# Patient Record
Sex: Male | Born: 1950 | Race: White | Hispanic: No | Marital: Single | Smoking: Former smoker
Health system: Southern US, Community
[De-identification: ages and names within clinical notes are randomized; demographics above are authoritative.]

## PROBLEM LIST (undated history)

## (undated) DIAGNOSIS — E119 Type 2 diabetes mellitus without complications: Secondary | ICD-10-CM

## (undated) DIAGNOSIS — G629 Polyneuropathy, unspecified: Secondary | ICD-10-CM

## (undated) DIAGNOSIS — E785 Hyperlipidemia, unspecified: Secondary | ICD-10-CM

## (undated) DIAGNOSIS — T4145XA Adverse effect of unspecified anesthetic, initial encounter: Secondary | ICD-10-CM

## (undated) DIAGNOSIS — T8859XA Other complications of anesthesia, initial encounter: Secondary | ICD-10-CM

## (undated) DIAGNOSIS — Z9289 Personal history of other medical treatment: Secondary | ICD-10-CM

## (undated) DIAGNOSIS — I10 Essential (primary) hypertension: Secondary | ICD-10-CM

## (undated) DIAGNOSIS — I2510 Atherosclerotic heart disease of native coronary artery without angina pectoris: Secondary | ICD-10-CM

## (undated) HISTORY — PX: WISDOM TOOTH EXTRACTION: SHX21

## (undated) HISTORY — PX: CORONARY ANGIOPLASTY WITH STENT PLACEMENT: SHX49

---

## 1982-04-27 HISTORY — PX: APPENDECTOMY: SHX54

## 1992-04-27 HISTORY — PX: ANGIOPLASTY: SHX39

## 2012-04-27 HISTORY — PX: AMPUTATION: SHX166

## 2013-04-20 HISTORY — PX: BELOW KNEE LEG AMPUTATION: SUR23

## 2013-07-14 ENCOUNTER — Encounter (HOSPITAL_BASED_OUTPATIENT_CLINIC_OR_DEPARTMENT_OTHER): Payer: Self-pay | Admitting: Emergency Medicine

## 2013-07-14 ENCOUNTER — Emergency Department (HOSPITAL_BASED_OUTPATIENT_CLINIC_OR_DEPARTMENT_OTHER)
Admission: EM | Admit: 2013-07-14 | Discharge: 2013-07-14 | Disposition: A | Discharge status: Home / Self Care | Payer: Medicare Other | Attending: Emergency Medicine | Admitting: Emergency Medicine

## 2013-07-14 DIAGNOSIS — Z79899 Other long term (current) drug therapy: Secondary | ICD-10-CM | POA: Insufficient documentation

## 2013-07-14 DIAGNOSIS — E119 Type 2 diabetes mellitus without complications: Secondary | ICD-10-CM | POA: Insufficient documentation

## 2013-07-14 DIAGNOSIS — Y836 Removal of other organ (partial) (total) as the cause of abnormal reaction of the patient, or of later complication, without mention of misadventure at the time of the procedure: Secondary | ICD-10-CM | POA: Insufficient documentation

## 2013-07-14 DIAGNOSIS — Z9861 Coronary angioplasty status: Secondary | ICD-10-CM | POA: Insufficient documentation

## 2013-07-14 DIAGNOSIS — Z880 Allergy status to penicillin: Secondary | ICD-10-CM | POA: Insufficient documentation

## 2013-07-14 DIAGNOSIS — Z87891 Personal history of nicotine dependence: Secondary | ICD-10-CM | POA: Insufficient documentation

## 2013-07-14 DIAGNOSIS — I10 Essential (primary) hypertension: Secondary | ICD-10-CM | POA: Insufficient documentation

## 2013-07-14 DIAGNOSIS — T8131XA Disruption of external operation (surgical) wound, not elsewhere classified, initial encounter: Secondary | ICD-10-CM | POA: Diagnosis present

## 2013-07-14 HISTORY — DX: Type 2 diabetes mellitus without complications: E11.9

## 2013-07-14 HISTORY — DX: Essential (primary) hypertension: I10

## 2013-07-14 NOTE — Discharge Instructions (Signed)
Wound Dehiscence Wound dehiscence is when a surgical cut (incision) opens up and does not heal properly. It usually happens 7 10 days after surgery. You may have bleeding from the cut. You may also have pain or a fever. This condition should be treated early. HOME CARE  Only take medicines as told by your doctor.  Take your antibiotic medicine as told. Finish it even if you start to feel better.  Wash your wound with warm, soapy water 2 times a day, or as told. Pat the wound dry. Do not rub the wound.  Change bandages (dressings) as often as told. Wash your hands before and after changing bandages. Apply bandages as told.  Take showers. Do not soak the wound, bathe, or swim until your wound is healed.  Avoid exercises that make you sweat.  Use medicines that stop itching as told by your doctor. The wound may itch as it heals. Do not pick or scratch at the wound.  Do not lift more than 10 pounds (4.5 kilograms) until the wound is healed, or as told by your doctor.  Keep all doctor visits as told. GET HELP IF:  You have a lot of bleeding from your surgical cut.  Your wound does not seem to be healing right. GET HELP RIGHT AWAY IF:   You have more puffiness (swelling) or redness around the wound.  You have more pain in the wound.  You have yellowish white fluid (pus) coming from the wound.  More of the wound breaks open.  You have a fever. MAKE SURE YOU:   Understand these instructions.  Will watch your condition.  Will get help right away if you are not doing well or get worse. Document Released: 04/01/2009 Document Revised: 02/01/2013 Document Reviewed: 12/19/2012 Exit[employer] Patient Information 2014 [employer].  

## 2013-07-14 NOTE — ED Notes (Signed)
Pt was in [place]-dx with spider bite that resulted in left leg BKA Apr 14, 2013-Pt states he was dropped on leg stump by PT this week and wound opened up again-pt [place] today and came to ED-pt request to have area assessed
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
## 2013-07-20 ENCOUNTER — Encounter (HOSPITAL_COMMUNITY): Payer: Self-pay | Admitting: *Deleted

## 2013-07-20 ENCOUNTER — Other Ambulatory Visit (HOSPITAL_COMMUNITY): Payer: Self-pay | Admitting: Orthopedic Surgery

## 2013-07-20 MED ORDER — CLINDAMYCIN PHOSPHATE 900 MG/50ML IV SOLN
900.0000 mg | INTRAVENOUS | Status: AC
  Administered 2013-07-21: 900 mg via INTRAVENOUS

## 2013-07-20 NOTE — Progress Notes (Signed)
Mr Ryan Owens is coming in for revision of left below knee amputation.  Mr Ryan Owens was bitten by a posionious spider [place] the [place].  Patient has had numerous surgery starting on sole of foot, up to BKA , done 04/19/14.Mr Ryan Owens has a hx CAD and DM, he had an angioplasty in 1994, and an angio[last with stent due in 2011.  [facility] with [facility].  Mr Ryan Owens does not have a cardiologist or PCP, medications were written by a MD in [place] before the trip to [place].  Patient has a list  Of medications, not prescription bottles.  All medications are in "pill box" and he is not sure which one is which.  Patient is to bring Medication list ( which is written in [place]) to the hospital with him in am. Patient was able to read some of medications to me, Metofromin, Metoprolol, Simvastin, Flagyl.. I instructed him to not take any medications in the am, since he can not identify medications.  Mr Ryan Owens denies chest pain, shortness of breath, lightheadedness.  I spoke with Dr Massage and informed him of history.  Dr. Massage said to get [facility] and that patient will be examined when he arrives tomorrow.  I faxed request to [facility],  [facility] is only open [phone].

## 2013-07-21 ENCOUNTER — Inpatient Hospital Stay (HOSPITAL_COMMUNITY): Payer: Medicare Other | Admitting: Critical Care Medicine

## 2013-07-21 ENCOUNTER — Encounter (HOSPITAL_COMMUNITY)
Admission: RE | Disposition: A | Discharge status: Home Health | Payer: Self-pay | Source: Ambulatory Visit | Attending: Orthopedic Surgery

## 2013-07-21 ENCOUNTER — Encounter (HOSPITAL_COMMUNITY): Payer: Medicare Other | Admitting: Critical Care Medicine

## 2013-07-21 ENCOUNTER — Encounter (HOSPITAL_COMMUNITY): Payer: Self-pay | Admitting: *Deleted

## 2013-07-21 ENCOUNTER — Inpatient Hospital Stay (HOSPITAL_COMMUNITY)
Admission: RE | Admit: 2013-07-21 | Discharge: 2013-07-24 | DRG: 475 | Disposition: A | Discharge status: Home Health | Payer: Medicare Other | Source: Ambulatory Visit | Attending: Orthopedic Surgery | Admitting: Orthopedic Surgery

## 2013-07-21 ENCOUNTER — Inpatient Hospital Stay (HOSPITAL_COMMUNITY): Payer: Medicare Other

## 2013-07-21 DIAGNOSIS — Z9861 Coronary angioplasty status: Secondary | ICD-10-CM

## 2013-07-21 DIAGNOSIS — E785 Hyperlipidemia, unspecified: Secondary | ICD-10-CM | POA: Diagnosis present

## 2013-07-21 DIAGNOSIS — I10 Essential (primary) hypertension: Secondary | ICD-10-CM | POA: Diagnosis present

## 2013-07-21 DIAGNOSIS — T879 Unspecified complications of amputation stump: Secondary | ICD-10-CM

## 2013-07-21 DIAGNOSIS — M869 Osteomyelitis, unspecified: Secondary | ICD-10-CM | POA: Diagnosis present

## 2013-07-21 DIAGNOSIS — S98919A Complete traumatic amputation of unspecified foot, level unspecified, initial encounter: Secondary | ICD-10-CM

## 2013-07-21 DIAGNOSIS — Z87891 Personal history of nicotine dependence: Secondary | ICD-10-CM

## 2013-07-21 DIAGNOSIS — M9080 Osteopathy in diseases classified elsewhere, unspecified site: Secondary | ICD-10-CM | POA: Diagnosis present

## 2013-07-21 DIAGNOSIS — E1169 Type 2 diabetes mellitus with other specified complication: Secondary | ICD-10-CM | POA: Diagnosis present

## 2013-07-21 DIAGNOSIS — T8789 Other complications of amputation stump: Principal | ICD-10-CM | POA: Diagnosis present

## 2013-07-21 DIAGNOSIS — G589 Mononeuropathy, unspecified: Secondary | ICD-10-CM | POA: Diagnosis present

## 2013-07-21 HISTORY — DX: Other complications of anesthesia, initial encounter: T88.59XA

## 2013-07-21 HISTORY — PX: AMPUTATION: SHX166

## 2013-07-21 HISTORY — DX: Adverse effect of unspecified anesthetic, initial encounter: T41.45XA

## 2013-07-21 HISTORY — DX: Personal history of other medical treatment: Z92.89

## 2013-07-21 HISTORY — DX: Hyperlipidemia, unspecified: E78.5

## 2013-07-21 HISTORY — DX: Polyneuropathy, unspecified: G62.9

## 2013-07-21 LAB — CBC
HCT: 37 % — ABNORMAL LOW (ref 39.0–52.0)
HEMOGLOBIN: 13.1 g/dL (ref 13.0–17.0)
MCH: 30.2 pg (ref 26.0–34.0)
MCHC: 35.4 g/dL (ref 30.0–36.0)
MCV: 85.3 fL (ref 78.0–100.0)
Platelets: 169 10*3/uL (ref 150–400)
RBC: 4.34 MIL/uL (ref 4.22–5.81)
RDW: 14.9 % (ref 11.5–15.5)
WBC: 7.5 10*3/uL (ref 4.0–10.5)

## 2013-07-21 LAB — COMPREHENSIVE METABOLIC PANEL
ALK PHOS: 56 U/L (ref 39–117)
ALT: 17 U/L (ref 0–53)
AST: 14 U/L (ref 0–37)
Albumin: 3.7 g/dL (ref 3.5–5.2)
BILIRUBIN TOTAL: 0.5 mg/dL (ref 0.3–1.2)
BUN: 16 mg/dL (ref 6–23)
CO2: 23 meq/L (ref 19–32)
Calcium: 9.2 mg/dL (ref 8.4–10.5)
Chloride: 103 mEq/L (ref 96–112)
Creatinine, Ser: 0.9 mg/dL (ref 0.50–1.35)
GFR, EST NON AFRICAN AMERICAN: 89 mL/min — AB (ref >90)
GLUCOSE: 157 mg/dL — AB (ref 70–99)
Potassium: 4.5 mEq/L (ref 3.7–5.3)
Sodium: 140 mEq/L (ref 137–147)
Total Protein: 6.9 g/dL (ref 6.0–8.3)

## 2013-07-21 LAB — PROTIME-INR
INR: 1.06 (ref 0.00–1.49)
Prothrombin Time: 13.6 seconds (ref 11.6–15.2)

## 2013-07-21 LAB — GLUCOSE, CAPILLARY
GLUCOSE-CAPILLARY: 156 mg/dL — AB (ref 70–99)
Glucose-Capillary: 125 mg/dL — ABNORMAL HIGH (ref 70–99)
Glucose-Capillary: 258 mg/dL — ABNORMAL HIGH (ref 70–99)

## 2013-07-21 LAB — APTT: aPTT: 27 seconds (ref 24–37)

## 2013-07-21 SURGERY — AMPUTATION BELOW KNEE
Anesthesia: General | Site: Leg Lower | Laterality: Left

## 2013-07-21 MED ORDER — INSULIN ASPART 100 UNIT/ML ~~LOC~~ SOLN
0.0000 [IU] | Freq: Every day | SUBCUTANEOUS | Status: DC
Start: 2013-07-21 — End: 2013-07-24
  Administered 2013-07-21: 3 [IU] via SUBCUTANEOUS
  Administered 2013-07-23: 2 [IU] via SUBCUTANEOUS

## 2013-07-21 MED ORDER — OXYCODONE-ACETAMINOPHEN 5-325 MG PO TABS
1.0000 | ORAL_TABLET | ORAL | Status: DC | PRN
  Administered 2013-07-21 – 2013-07-23 (×8): 2 via ORAL
  Administered 2013-07-23: 1 via ORAL
  Administered 2013-07-23 – 2013-07-24 (×4): 2 via ORAL
  Filled 2013-07-21 (×3): qty 2
  Filled 2013-07-21: qty 1
  Filled 2013-07-21 (×7): qty 2

## 2013-07-21 MED ORDER — METFORMIN HCL 500 MG PO TABS
500.0000 mg | ORAL_TABLET | Freq: Two times a day (BID) | ORAL | Status: DC
  Administered 2013-07-22 – 2013-07-24 (×5): 500 mg via ORAL
  Filled 2013-07-21 (×7): qty 1

## 2013-07-21 MED ORDER — METOCLOPRAMIDE HCL 10 MG PO TABS: 5.0000 mg | ORAL_TABLET | Freq: Three times a day (TID) | ORAL | Status: DC | PRN

## 2013-07-21 MED ORDER — HYDROMORPHONE HCL PF 1 MG/ML IJ SOLN
INTRAMUSCULAR | Status: AC
Start: 2013-07-21 — End: 2013-07-22
  Filled 2013-07-21: qty 1

## 2013-07-21 MED ORDER — ASPIRIN EC 325 MG PO TBEC
325.0000 mg | DELAYED_RELEASE_TABLET | Freq: Every day | ORAL | Status: DC
Start: 2013-07-21 — End: 2013-07-24
  Administered 2013-07-21 – 2013-07-24 (×4): 325 mg via ORAL
  Filled 2013-07-21 (×4): qty 1

## 2013-07-21 MED ORDER — HYDROMORPHONE HCL PF 1 MG/ML IJ SOLN
0.2500 mg | INTRAMUSCULAR | Status: DC | PRN
  Administered 2013-07-21 (×4): 0.5 mg via INTRAVENOUS

## 2013-07-21 MED ORDER — METHOCARBAMOL 500 MG PO TABS
ORAL_TABLET | ORAL | Status: AC
  Filled 2013-07-21: qty 1

## 2013-07-21 MED ORDER — METOPROLOL SUCCINATE ER 25 MG PO TB24
25.0000 mg | ORAL_TABLET | Freq: Once | ORAL | Status: AC
  Administered 2013-07-21: 25 mg via ORAL
  Filled 2013-07-21: qty 1

## 2013-07-21 MED ORDER — FENTANYL CITRATE 0.05 MG/ML IJ SOLN
INTRAMUSCULAR | Status: DC | PRN
  Administered 2013-07-21: 50 ug via INTRAVENOUS

## 2013-07-21 MED ORDER — ISOSORBIDE MONONITRATE 10 MG PO TABS
10.0000 mg | ORAL_TABLET | Freq: Every day | ORAL | Status: DC
  Administered 2013-07-21 – 2013-07-24 (×4): 10 mg via ORAL
  Filled 2013-07-21 (×4): qty 1

## 2013-07-21 MED ORDER — PROMETHAZINE HCL 25 MG PO TABS
25.0000 mg | ORAL_TABLET | Freq: Every day | ORAL | Status: DC
  Administered 2013-07-22 – 2013-07-24 (×3): 25 mg via ORAL
  Filled 2013-07-21 (×3): qty 1

## 2013-07-21 MED ORDER — HYDROMORPHONE HCL PF 1 MG/ML IJ SOLN
INTRAMUSCULAR | Status: AC
  Filled 2013-07-21: qty 1

## 2013-07-21 MED ORDER — INSULIN ASPART 100 UNIT/ML ~~LOC~~ SOLN: 0.0000 [IU] | Freq: Three times a day (TID) | SUBCUTANEOUS | Status: DC

## 2013-07-21 MED ORDER — METOPROLOL SUCCINATE ER 25 MG PO TB24
25.0000 mg | ORAL_TABLET | Freq: Every day | ORAL | Status: DC
  Administered 2013-07-21 – 2013-07-24 (×4): 25 mg via ORAL
  Filled 2013-07-21 (×4): qty 1

## 2013-07-21 MED ORDER — ONDANSETRON HCL 4 MG PO TABS: 4.0000 mg | ORAL_TABLET | Freq: Four times a day (QID) | ORAL | Status: DC | PRN

## 2013-07-21 MED ORDER — METOCLOPRAMIDE HCL 5 MG/ML IJ SOLN: 5.0000 mg | Freq: Three times a day (TID) | INTRAMUSCULAR | Status: DC | PRN

## 2013-07-21 MED ORDER — FENTANYL CITRATE 0.05 MG/ML IJ SOLN
INTRAMUSCULAR | Status: AC
  Filled 2013-07-21: qty 5

## 2013-07-21 MED ORDER — CLINDAMYCIN PHOSPHATE 900 MG/50ML IV SOLN
900.0000 mg | Freq: Once | INTRAVENOUS | Status: DC
  Filled 2013-07-21: qty 50

## 2013-07-21 MED ORDER — MIDAZOLAM HCL 2 MG/2ML IJ SOLN
INTRAMUSCULAR | Status: AC
  Filled 2013-07-21: qty 2

## 2013-07-21 MED ORDER — OXYCODONE HCL 5 MG/5ML PO SOLN: 5.0000 mg | Freq: Once | ORAL | Status: AC | PRN

## 2013-07-21 MED ORDER — MIDAZOLAM HCL 5 MG/5ML IJ SOLN
INTRAMUSCULAR | Status: DC | PRN
  Administered 2013-07-21: 2 mg via INTRAVENOUS

## 2013-07-21 MED ORDER — LIDOCAINE HCL (CARDIAC) 20 MG/ML IV SOLN
INTRAVENOUS | Status: DC | PRN
Start: 2013-07-21 — End: 2013-07-21
  Administered 2013-07-21: 80 mg via INTRAVENOUS

## 2013-07-21 MED ORDER — DEXTROSE 5 % IV SOLN
500.0000 mg | Freq: Four times a day (QID) | INTRAVENOUS | Status: DC | PRN
Start: 2013-07-21 — End: 2013-07-24
  Filled 2013-07-21: qty 5

## 2013-07-21 MED ORDER — SIMVASTATIN 40 MG PO TABS
40.0000 mg | ORAL_TABLET | Freq: Every day | ORAL | Status: DC
  Administered 2013-07-21 – 2013-07-23 (×3): 40 mg via ORAL
  Filled 2013-07-21 (×4): qty 1

## 2013-07-21 MED ORDER — METHOCARBAMOL 500 MG PO TABS
500.0000 mg | ORAL_TABLET | Freq: Four times a day (QID) | ORAL | Status: DC | PRN
  Administered 2013-07-21 – 2013-07-23 (×7): 500 mg via ORAL
  Filled 2013-07-21 (×7): qty 1

## 2013-07-21 MED ORDER — OXYCODONE HCL 5 MG PO TABS
ORAL_TABLET | ORAL | Status: AC
  Filled 2013-07-21: qty 1

## 2013-07-21 MED ORDER — PROMETHAZINE HCL 25 MG/ML IJ SOLN: 6.2500 mg | INTRAMUSCULAR | Status: DC | PRN

## 2013-07-21 MED ORDER — CLINDAMYCIN PHOSPHATE 600 MG/50ML IV SOLN
600.0000 mg | Freq: Four times a day (QID) | INTRAVENOUS | Status: AC
  Administered 2013-07-21 – 2013-07-22 (×3): 600 mg via INTRAVENOUS
  Filled 2013-07-21 (×3): qty 50

## 2013-07-21 MED ORDER — OXYCODONE-ACETAMINOPHEN 5-325 MG PO TABS
ORAL_TABLET | ORAL | Status: AC
  Administered 2013-07-21: 21:00:00
  Filled 2013-07-21: qty 2

## 2013-07-21 MED ORDER — LACTATED RINGERS IV SOLN
INTRAVENOUS | Status: DC
  Administered 2013-07-21: 15:00:00 via INTRAVENOUS

## 2013-07-21 MED ORDER — HYDROMORPHONE HCL PF 1 MG/ML IJ SOLN
0.5000 mg | INTRAMUSCULAR | Status: DC | PRN
  Administered 2013-07-22 – 2013-07-23 (×4): 1 mg via INTRAVENOUS
  Filled 2013-07-21 (×4): qty 1

## 2013-07-21 MED ORDER — SODIUM CHLORIDE 0.9 % IV SOLN
INTRAVENOUS | Status: DC
  Administered 2013-07-21: 19:00:00 via INTRAVENOUS

## 2013-07-21 MED ORDER — ONDANSETRON HCL 4 MG/2ML IJ SOLN: 4.0000 mg | Freq: Four times a day (QID) | INTRAMUSCULAR | Status: DC | PRN

## 2013-07-21 MED ORDER — OXYCODONE HCL 5 MG PO TABS
5.0000 mg | ORAL_TABLET | Freq: Once | ORAL | Status: AC | PRN
  Administered 2013-07-21: 5 mg via ORAL

## 2013-07-21 MED ORDER — PROPOFOL 10 MG/ML IV BOLUS
INTRAVENOUS | Status: DC | PRN
  Administered 2013-07-21: 170 mg via INTRAVENOUS

## 2013-07-21 MED ORDER — INSULIN ASPART 100 UNIT/ML ~~LOC~~ SOLN
0.0000 [IU] | Freq: Three times a day (TID) | SUBCUTANEOUS | Status: DC
Start: 2013-07-22 — End: 2013-07-24
  Administered 2013-07-22: 3 [IU] via SUBCUTANEOUS
  Administered 2013-07-22 (×2): 5 [IU] via SUBCUTANEOUS
  Administered 2013-07-23: 3 [IU] via SUBCUTANEOUS
  Administered 2013-07-23 (×2): 5 [IU] via SUBCUTANEOUS
  Administered 2013-07-24: 15 [IU] via SUBCUTANEOUS
  Administered 2013-07-24: 3 [IU] via SUBCUTANEOUS

## 2013-07-21 MED ORDER — 0.9 % SODIUM CHLORIDE (POUR BTL) OPTIME
TOPICAL | Status: DC | PRN
  Administered 2013-07-21: 1000 mL

## 2013-07-21 SURGICAL SUPPLY — 44 items
BANDAGE ESMARK 6X9 LF (GAUZE/BANDAGES/DRESSINGS) IMPLANT
BANDAGE GAUZE ELAST BULKY 4 IN (GAUZE/BANDAGES/DRESSINGS) IMPLANT
BLADE SAW RECIP 87.9 MT (BLADE) ×3 IMPLANT
BLADE SURG 10 STRL SS (BLADE) ×3 IMPLANT
BLADE SURG 21 STRL SS (BLADE) ×3 IMPLANT
BNDG COHESIVE 6X5 TAN STRL LF (GAUZE/BANDAGES/DRESSINGS) ×3 IMPLANT
BNDG ESMARK 6X9 LF (GAUZE/BANDAGES/DRESSINGS)
BNDG GAUZE ELAST 4 BULKY (GAUZE/BANDAGES/DRESSINGS) ×3 IMPLANT
COVER SURGICAL LIGHT HANDLE (MISCELLANEOUS) ×3 IMPLANT
CUFF TOURNIQUET SINGLE 34IN LL (TOURNIQUET CUFF) IMPLANT
CUFF TOURNIQUET SINGLE 44IN (TOURNIQUET CUFF) IMPLANT
DRAIN PENROSE 1/2X12 LTX STRL (WOUND CARE) IMPLANT
DRAPE EXTREMITY T 121X128X90 (DRAPE) ×3 IMPLANT
DRAPE PROXIMA HALF (DRAPES) ×6 IMPLANT
DRAPE U-SHAPE 47X51 STRL (DRAPES) ×3 IMPLANT
DRSG ADAPTIC 3X8 NADH LF (GAUZE/BANDAGES/DRESSINGS) ×3 IMPLANT
DRSG PAD ABDOMINAL 8X10 ST (GAUZE/BANDAGES/DRESSINGS) ×6 IMPLANT
DURAPREP 26ML APPLICATOR (WOUND CARE) IMPLANT
ELECT REM PT RETURN 9FT ADLT (ELECTROSURGICAL) ×3
ELECTRODE REM PT RTRN 9FT ADLT (ELECTROSURGICAL) ×1 IMPLANT
GLOVE BIOGEL PI IND STRL 9 (GLOVE) ×1 IMPLANT
GLOVE BIOGEL PI INDICATOR 9 (GLOVE) ×2
GLOVE SURG ORTHO 9.0 STRL STRW (GLOVE) ×3 IMPLANT
GOWN STRL REUS W/ TWL XL LVL3 (GOWN DISPOSABLE) ×1 IMPLANT
GOWN STRL REUS W/TWL XL LVL3 (GOWN DISPOSABLE) ×2
KIT BASIN OR (CUSTOM PROCEDURE TRAY) ×3 IMPLANT
KIT ROOM TURNOVER OR (KITS) ×3 IMPLANT
MANIFOLD NEPTUNE II (INSTRUMENTS) IMPLANT
NS IRRIG 1000ML POUR BTL (IV SOLUTION) ×3 IMPLANT
PACK GENERAL/GYN (CUSTOM PROCEDURE TRAY) ×3 IMPLANT
PAD ARMBOARD 7.5X6 YLW CONV (MISCELLANEOUS) ×3 IMPLANT
SPONGE GAUZE 4X4 12PLY (GAUZE/BANDAGES/DRESSINGS) ×3 IMPLANT
SPONGE LAP 18X18 X RAY DECT (DISPOSABLE) IMPLANT
STAPLER VISISTAT 35W (STAPLE) ×3 IMPLANT
STOCKINETTE IMPERVIOUS LG (DRAPES) ×3 IMPLANT
SUT ETHILON 2 0 PSLX (SUTURE) ×12 IMPLANT
SUT PDS AB 1 CT  36 (SUTURE)
SUT PDS AB 1 CT 36 (SUTURE) IMPLANT
SUT SILK 2 0 (SUTURE)
SUT SILK 2-0 18XBRD TIE 12 (SUTURE) IMPLANT
TOWEL OR 17X24 6PK STRL BLUE (TOWEL DISPOSABLE) ×3 IMPLANT
TOWEL OR 17X26 10 PK STRL BLUE (TOWEL DISPOSABLE) ×3 IMPLANT
TUBE ANAEROBIC SPECIMEN COL (MISCELLANEOUS) IMPLANT
WATER STERILE IRR 1000ML POUR (IV SOLUTION) IMPLANT

## 2013-07-21 NOTE — Progress Notes (Signed)
No records were found at [facility].  I called patient and [facility] and Dr Lodema HongJohn Roberts was the cardiologist.  I phoned Dr Su Hiltoberts office and was informed that Dr Su Hiltoberts is no longer there, but the records should be.  I asked what hospital Dr Su Hiltoberts would have done procedures in 2011 and I was told [facility].   I faxed a request to Dr office and [facility].

## 2013-07-21 NOTE — Transfer of Care (Signed)
Immediate Anesthesia Transfer of Care Note  Patient: Ryan Owens  Procedure(s) Performed: Procedure(s) with comments: AMPUTATION BELOW KNEE (Left) - Revision Left Below Knee Amputation  Patient Location: PACU  Anesthesia Type:General  Level of Consciousness: awake, alert  and patient cooperative  Airway & Oxygen Therapy: Patient Spontanous Breathing  Post-op Assessment: Report given to PACU RN and Post -op Vital signs reviewed and stable  Post vital signs: Reviewed and stable  Complications: No apparent anesthesia complications

## 2013-07-21 NOTE — Anesthesia Postprocedure Evaluation (Signed)
Anesthesia Post Note  Patient: Ryan Owens  Procedure(s) Performed: Procedure(s) (LRB): AMPUTATION BELOW KNEE (Left)  Anesthesia type: General  Patient location: PACU  Post pain: Pain level controlled and Adequate analgesia  Post assessment: Post-op Vital signs reviewed, Patient's Cardiovascular Status Stable, Respiratory Function Stable, Patent Airway and Pain level controlled  Last Vitals:  Filed Vitals:   07/21/13 1730  BP: 98/65  Pulse: 75  Temp: 36.9 C  Resp: 16    Post vital signs: Reviewed and stable  Level of consciousness: awake, alert  and oriented  Complications: No apparent anesthesia complications

## 2013-07-21 NOTE — H&P (Signed)
Ryan Owens is an 63 y.o. male.   Chief Complaint: Dehiscence left transtibial amputation HPI: Patient is a [age] year old gentleman diabetic insensate neuropathy status post amputation on the left with progressive dehiscence and exposed bone.  Past Medical History  Diagnosis Date  . Diabetes mellitus without complication   . Hypertension   . Neuropathy   . Complication of anesthesia     spinal and  epidural "cant take"  allergic  . Hyperlipemia   . History of blood transfusion     Past Surgical History  Procedure Laterality Date  . 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 Appendectomy  1984  . Coronary angioplasty with stent placement    . Angioplasty  1994  . Wisdom tooth extraction    . Amputation Left 2014    toe, foot,     History reviewed. No pertinent family history. Social History:  reports that he has quit smoking. He does not have any smokeless tobacco history on file. He reports that he does not drink alcohol or use illicit drugs.  Allergies:  Allergies  Allergen Reactions  . Other     Med thru epidural-unknown med  . Penicillins     No prescriptions prior to admission    No results found for this or any previous visit (from the past 48 hour(s)). No results found.  Review of Systems  All other systems reviewed and are negative.    There were no vitals taken for this visit. Physical Exam  On examination patient has dehiscence with exposed bone left transtibial amputation Assessment/Plan Assessment: Dehiscence with exposed bone left transtibial amputation.  Plan: We'll plan for revision left transtibial amputation. Risks and benefits were discussed including nonhealing of the wound need for additional surgery. Patient states he understands and wished to proceed at this time.  Lulani Bour V 07/21/2013, 6:57 AM

## 2013-07-21 NOTE — Progress Notes (Signed)
Anticipate discharge to home on Monday.

## 2013-07-21 NOTE — Op Note (Signed)
OPERATIVE REPORT  DATE OF SURGERY: 07/21/2013  PATIENT:  Ryan Owens,  63 y.o. male  PRE-OPERATIVE DIAGNOSIS:  Osteomyelitis, Ulcer Left BKA  POST-OPERATIVE DIAGNOSIS:  Osteomyelitis, Ulcer Left BKA  PROCEDURE:  Procedure(s): AMPUTATION BELOW KNEE revision  SURGEON:  Surgeon(s): Nadara MustardMarcus V Emerly Prak, MD  ANESTHESIA:   general  EBL:  min ML  SPECIMEN:  No Specimen  TOURNIQUET:  * No tourniquets in log *  PROCEDURE DETAILS: Patient is a 63 year old gentleman who is status post multiple surgeries for foot salvage in the [place]. Patient eventually underwent a transtibial amputation. He presented this time with exposed bone dehiscence of the surgical incision with a surgical incision this is also noted distal on the stump not in a good position for weightbearing and presents at this time for revision transtibial amputation with local tissue rearrangement. Risks and benefits were discussed including infection neurovascular injury nonhealing of the wound need for additional surgery. Patient states he understands and wishes to proceed at this time. Description of procedure patient was brought to the operating room and underwent a general anesthetic. After adequate levels of anesthesia were obtained patient's left lower extremity was prepped using Betadine paint and draped into a sterile field. A fishmouth incision was made around the ulcerative tissue. This was carried down to bone possibly 3 cm of the distal tibia and fibula were resected. There is a large amount of necrotic muscle which was debrided back to bleeding viable healthy muscle. The posterior flap was then transposed anteriorly the wound was closed using 2-0 nylon and staples. Wound was covered Adaptic orthopedic sponges ABDs dressing Kerlix and Coban. Patient was extubated taken to the PACU in stable condition.  PLAN OF CARE: Admit to inpatient   PATIENT DISPOSITION:  PACU - hemodynamically stable.   Nadara MustardUDA,Kynsley Whitehouse V,  MD 07/21/2013 5:07 PM

## 2013-07-21 NOTE — Anesthesia Preprocedure Evaluation (Signed)
Anesthesia Evaluation    History of Anesthesia Complications (+) history of anesthetic complications  Airway       Dental   Pulmonary former smoker,          Cardiovascular hypertension, + CAD, + Cardiac Stents and + Peripheral Vascular Disease     Neuro/Psych    GI/Hepatic   Endo/Other  diabetes  Renal/GU      Musculoskeletal   Abdominal   Peds  Hematology   Anesthesia Other Findings   Reproductive/Obstetrics                           Anesthesia Physical Anesthesia Plan  ASA: III  Anesthesia Plan: General   Post-op Pain Management:    Induction: Intravenous  Airway Management Planned: LMA  Additional Equipment:   Intra-op Plan:   Post-operative Plan: Extubation in OR  Informed Consent: I have reviewed the patients History and Physical, chart, labs and discussed the procedure including the risks, benefits and alternatives for the proposed anesthesia with the patient or authorized representative who has indicated his/her understanding and acceptance.     Plan Discussed with: CRNA and Surgeon  Anesthesia Plan Comments:         Anesthesia Quick Evaluation

## 2013-07-21 NOTE — Anesthesia Procedure Notes (Signed)
Procedure Name: LMA Insertion Date/Time: 07/21/2013 4:26 PM Performed by: Arlice ColtMANESS, Joella Saefong B Pre-anesthesia Checklist: Patient identified, Emergency Drugs available, Suction available, Patient being monitored and Timeout performed Patient Re-evaluated:Patient Re-evaluated prior to inductionOxygen Delivery Method: Circle system utilized Preoxygenation: Pre-oxygenation with 100% oxygen Intubation Type: IV induction LMA: LMA inserted LMA Size: 4.0 Number of attempts: 1 Placement Confirmation: positive ETCO2 and breath sounds checked- equal and bilateral Tube secured with: Tape Dental Injury: Teeth and Oropharynx as per pre-operative assessment

## 2013-07-22 LAB — GLUCOSE, CAPILLARY
GLUCOSE-CAPILLARY: 211 mg/dL — AB (ref 70–99)
Glucose-Capillary: 181 mg/dL — ABNORMAL HIGH (ref 70–99)
Glucose-Capillary: 209 mg/dL — ABNORMAL HIGH (ref 70–99)
Glucose-Capillary: 164 mg/dL — ABNORMAL HIGH (ref 70–99)

## 2013-07-22 LAB — HEMOGLOBIN A1C
Hgb A1c MFr Bld: 6.8 % — ABNORMAL HIGH (ref <5.7)
Mean Plasma Glucose: 148 mg/dL — ABNORMAL HIGH (ref <117)

## 2013-07-22 NOTE — Progress Notes (Signed)
Subjective: 1 Day Post-Op Procedure(s) (LRB): AMPUTATION BELOW KNEE (Left) Patient reports pain as 5 on 0-10 scale.    Objective: Vital signs in last 24 hours: Temp:  [98 F (36.7 C)-98.8 F (37.1 C)] 98 F (36.7 C) (03/28 0518) Pulse Rate:  [70-84] 72 (03/28 0518) Resp:  [15-18] 16 (03/28 0518) BP: (98-162)/(38-82) 117/65 mmHg (03/28 0518) SpO2:  [95 %-100 %] 99 % (03/28 0518) Weight:  [86.183 kg (190 lb)] 86.183 kg (190 lb) (03/27 1349)  Intake/Output from previous day: 03/27 0701 - 03/28 0700 In: 769 [P.O.:240; I.V.:429; IV Piggyback:100] Out: 1150 [Urine:1050; Blood:100] Intake/Output this shift:     Recent Labs  07/21/13 1342  HGB 13.1    Recent Labs  07/21/13 1342  WBC 7.5  RBC 4.34  HCT 37.0*  PLT 169    Recent Labs  07/21/13 1342  NA 140  K 4.5  CL 103  CO2 23  BUN 16  CREATININE 0.90  GLUCOSE 157*  CALCIUM 9.2    Recent Labs  07/21/13 1342  INR 1.06    dressing dry. C/O phantom sensations  Assessment/Plan: 1 Day Post-Op Procedure(s) (LRB): AMPUTATION BELOW KNEE (Left) Up with therapy  Home Sunday or Monday. Needs to be able to transfer safely.  Ryan Owens C 07/22/2013, 9:40 AM

## 2013-07-22 NOTE — Evaluation (Signed)
Occupational Therapy Evaluation Patient Details Name: Ryan Owens Goodness MRN: [id number] DOB: 05-16-1950 Today's Date: 07/22/2013    History of Present Illness Ryan Owens [place] is a 63 y.o. male with a history of DM and HTN who presents to the Emergency Department for a wound check. The patient states that he was [place] the [place] in August for a concert and was bit by a brown recluse on the left leg. He reports that he tends to have severe reactions to poisons such as poison ivy, oak and spider bites and this happened with the spider bite. He states that the area surrounding the leg became blistered and erythematous. 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 He was having the wound cleaned and dressed three times a day and was having PT done until he was dropped two days ago. He states that the wound reopened and has been draining since   Clinical Impression   Had lengthy discussion with pt regarding DME needs. Pt is adamant about not getting a 3in1 and states the toilet at his sister's house is adequate and that he doesn't need to practice the toilet transfer here on acute. He has an Charity fundraiseroutdoor lawn chair and would like to get a tubseat but does NOT want a tub transfer bench. He only wants the tubseat without handles. Explained benefits of both options and showed him a picture of the bench. Pt asking about a diabetic shoe also for R foot. Informed nursing of patient's inquiry. Pt did mention that he has some concerns about getting into his sister's house with bumping up the stairs. He states he does get fatigued and sometimes has to sit for 30 minutes until he can bump himself into the house. Depending on how long he stays at his sister's a ramp may be an option. Discussed this with evaluating PT and per discussion, issued pt handout with ramp building information. Discussed at length options for LB dressing for improved safety and less stress on R foot  including leaning side to side on the bed but pt is also very set on standing and holding to dresser or bed to be able to pull up clothing in standing and did fine with this today. Explained that leaning side to side is still an option especially if he has pain. Feel HHOT will be beneficial to assess home environment for additional safety recommendations and pt is agreeable.    Follow Up Recommendations  Home health OT    Equipment Recommendations  Tub/shower seat; pt declines need for 3in1.   Recommendations for Other Services       Precautions / Restrictions Precautions Precautions: Fall Precaution Comments: pt was doing standing exercises in RW when he fell with PT last week [place] Restrictions Weight Bearing Restrictions: Yes LLE Weight Bearing: Non weight bearing      Mobility Bed Mobility Overal bed mobility: Independent             General bed mobility comments: moves well  Transfers Overall transfer level: Needs assistance Equipment used: None Transfers: Sit to/from Stand Sit to Stand: Supervision         General transfer comment: pt transferred bed to recliner with squat pivot transfer, mod I. Educated pt on safest set up for transfers to prevent falls    Balance Overall balance assessment: Modified Independent (sitting)  ADL Eating/Feeding: Independent;Sitting Grooming: Wash/dry hands;Set up;Sitting   Upper Body Dressing : Set up;Sitting Lower Body Bathing: Supervison/ safety;Sit to/from stand Lower Body Dressing: Supervision/safety Toilet Transfer: Supervision/safety Toileting- Clothing Manipulation and Hygiene: Supervision/safety;Sit to/from stand     General ADL Comments: Pt's sister present for session and with many questions regarding DME and diabetic shoe. Pt requesting a diabetic shoe. Informed nursing of request. Pt is strongly confident that he does NOT need a 3in1 and when offered to  practice toilet transfer without 3in1 here pt states he will be just fine without practicing it here. He states he has been doing the toilet transfer before he came here and did fine. He has an Charity fundraiser in his tub and he would like a shower chair. Showed him a picture of the tub transfer bench and his is adamant about not getting a bench. He only wants a shower chair that all 4 legs fit inside the tub. He is askign about how to get into his sister's house where he has been bumping up the stairs but states he does get fatigued with this. Spoke with PT and discussed possibly a ramp being built and issued pt building a ramp handout per discussion with PT. Pt agreeable to [facility] assessment for bathroom safety. Pt is also adamant about not using a walker to transfer from wheechair to surfaces as he considers the walker to be a fall hazard.      Vision                     Perception     Praxis      Pertinent Vitals/Pain Pt states he is ok right now in regards to pain.     Hand Dominance     Extremity/Trunk Assessment Upper Extremity Assessment Upper Extremity Assessment: Overall WFL for tasks assessed   Lower Extremity Assessment Lower Extremity Assessment: RLE deficits/detail;LLE deficits/detail RLE Deficits / Details: WFL ROM and strength, pt with small wound on ball of right foot so hopping not that leg not safe at this point. Pt needs to be transfer level only. Wears diabetic shoe on right foot but needs a new one. RLE Sensation: history of peripheral neuropathy LLE Deficits / Details: knee flexion limited by bandaging, extension full LLE: Unable to fully assess due to immobilization LLE Sensation: history of peripheral neuropathy   Cervical / Trunk Assessment Cervical / Trunk Assessment: Normal   Communication Communication Communication: No difficulties   Cognition Arousal/Alertness: Awake/alert Behavior During Therapy: WFL for tasks assessed/performed Overall  Cognitive Status: Within Functional Limits for tasks assessed                     General Comments       Exercises Exercises: Amputee    Home Living Family/patient expects to be discharged to:: Private residence Living Arrangements: Other relatives Available Help at Discharge: Family;Available PRN/intermittently Type of Home: House Home Access: Stairs to enter Entrance Stairs-Number of Steps: 3   Home Layout: Laundry or work area in basement;Two level;Able to live on main level with bedroom/bathroom     Bathroom Shower/Tub: Chief Strategy Officer: Standard     Home Equipment: None   Additional Comments: pt lives if [place], plans to d/c home with sister here in [place]. She has a ramp but cannot push him up it in w/c so he has been sitting down and bumping up steps into house and over to couch and then rising  onto couch.       Prior Functioning/Environment Level of Independence: Independent (before spider bite)        Comments: pt has been in a hospital in the [place] since August. He plan to get a prosthesis when leg will tolerate. Pt has been using an outdoor chair to sit on and bathe in tub. He uses regular toilet and hold to sink and wheelchair to help pivot.     OT Diagnosis:     OT Problem List: Decreased strength;Decreased knowledge of use of DME or AE   OT Treatment/Interventions:      OT Goals(Current goals can be found in the care plan section) Acute Rehab OT Goals Patient Stated Goal: return to sister's house, get a prosthesis  OT Frequency:     Barriers to D/C:            End of Session:    Activity Tolerance: Patient tolerated treatment well Patient left: in chair;with call bell/phone within reach;with family/visitor present   Time: 1423-1506 OT Time Calculation (min): 43 min Charges:  OT General Charges $OT Visit: 1 Procedure OT Evaluation $Initial OT Evaluation Tier I: 1 Procedure OT Treatments $Self Care/Home  Management : 23-37 mins $Therapeutic Activity: 8-22 mins G-Codes:    Lennox Laity [phone] 07/22/2013, 3:38 PM

## 2013-07-22 NOTE — Evaluation (Addendum)
Physical Therapy Evaluation Patient Details Name: Ryan Owens MRN: [id number] DOB: 1951-04-04 Today's Date: 07/22/2013   History of Present Illness  Ryan Owens is a 63 y.o. male with a history of DM and HTN who presents to the Emergency Department for a wound check. The patient states that he was in the [place] in August for a concert and was bit by a brown recluse on the left leg. He reports that he tends to have severe reactions to poisons such as poison ivy, oak and spider bites and this happened with the spider bite. He states that the area surrounding the leg became blistered and erythematous. 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 He was having the wound cleaned and dressed three times a day and was having PT done until he was dropped two days ago. He states that the wound reopened and has been draining since  Clinical Impression  Pt transfers independently from surface to surface, do not recommend hopping on right leg at this point due to small wound plantar surface of right foot. Pt states that he needs a new diabetic shoe for the right foot. Pt will need w/c, RW, tub bench, and BSC. He has no further acute needs at this time but recommend outpt PT after d/c. Pt was given HEP and exercises were practiced. Thank you for referral.    Follow Up Recommendations Supervision - Intermittent;Home health PT    Equipment Recommendations  Rolling walker with 5" wheels;Wheelchair (measurements PT);Other (comment) (tub bench, BSC)    Recommendations for Other Services       Precautions / Restrictions Precautions Precautions: Fall Precaution Comments: pt was doing standing exercises in RW when he fell with PT last week in [place] Restrictions Weight Bearing Restrictions: Yes LLE Weight Bearing: Non weight bearing      Mobility  Bed Mobility Overal bed mobility: Independent             General bed mobility  comments: moves well  Transfers Overall transfer level: Modified independent Equipment used: None             General transfer comment: pt transferred bed to recliner with squat pivot transfer, mod I. Educated pt on safest set up for transfers to prevent falls  Ambulation/Gait             General Gait Details: NT due to wound on R foot  Stairs            Wheelchair Mobility    Modified Rankin (Stroke Patients Only)       Balance Overall balance assessment: Modified Independent (sitting)                                   Pertinent Vitals/Pain VSS    Home Living Family/patient expects to be discharged to:: Private residence Living Arrangements: Other relatives Available Help at Discharge: Family;Available PRN/intermittently Type of Home: House Home Access: Stairs to enter   Entrance Stairs-Number of Steps: 3 Home Layout: Laundry or work area in basement;Two level;Able to live on main level with bedroom/bathroom Home Equipment: None Additional Comments: pt lives if [place], plans to d/c home with sister here in [place]. She has a ramp but cannot push him up it in w/c so he has been sitting down and bumping up steps into house and over to couch and then rising onto couch.  Prior Function Level of Independence: Independent (before spider bite)         Comments: pt has been in a hospital in the [place] since August. He plan to get a prosthesis when leg will tolerate.     Hand Dominance        Extremity/Trunk Assessment   Upper Extremity Assessment: Overall WFL for tasks assessed           Lower Extremity Assessment: RLE deficits/detail;LLE deficits/detail RLE Deficits / Details: WFL ROM and strength, pt with small wound on ball of right foot so hopping not that leg not safe at this point. Pt needs to be transfer level only. Wears diabetic shoe on right foot but needs a new one. LLE Deficits / Details: knee flexion limited  by bandaging, extension full  Cervical / Trunk Assessment: Normal  Communication   Communication: No difficulties  Cognition Arousal/Alertness: Awake/alert Behavior During Therapy: WFL for tasks assessed/performed Overall Cognitive Status: Within Functional Limits for tasks assessed                      General Comments      Exercises Amputee Exercises Quad Sets: AROM;Left;10 reps;Seated Gluteal Sets: AROM;Left;10 reps;Seated Hip Extension: AROM;Left;10 reps;Supine Straight Leg Raises: AROM;Left;10 reps;Seated      Assessment/Plan    PT Assessment All further PT needs can be met in the next venue of care  PT Diagnosis Difficulty walking;Acute pain   PT Problem List Decreased mobility;Pain;Decreased knowledge of use of DME;Impaired sensation  PT Treatment Interventions     PT Goals (Current goals can be found in the Care Plan section) Acute Rehab PT Goals Patient Stated Goal: return to sister's house, get a prosthesis PT Goal Formulation: No goals set, d/c therapy    Frequency     Barriers to discharge        End of Session Equipment Utilized During Treatment: Gait belt Activity Tolerance: Patient tolerated treatment well Patient left: in chair;with call bell/phone within reach         Time: 3500-9381 PT Time Calculation (min): 29 min   Charges:   PT Evaluation $Initial PT Evaluation Tier I: 1 Procedure PT Treatments $Therapeutic Activity: 8-22 mins   PT G Codes:        Ryan Owens, PT  Acute Rehab Services  [place], [place] 07/22/2013, 3:02 PM

## 2013-07-23 LAB — GLUCOSE, CAPILLARY
GLUCOSE-CAPILLARY: 181 mg/dL — AB (ref 70–99)
Glucose-Capillary: 226 mg/dL — ABNORMAL HIGH (ref 70–99)
Glucose-Capillary: 219 mg/dL — ABNORMAL HIGH (ref 70–99)
Glucose-Capillary: 243 mg/dL — ABNORMAL HIGH (ref 70–99)

## 2013-07-23 NOTE — Progress Notes (Signed)
Subjective: 2 Days Post-Op Procedure(s) (LRB): AMPUTATION BELOW KNEE (Left) Patient reports pain as moderate.    Objective: Vital signs in last 24 hours: Temp:  [97.8 F (36.6 C)-100.1 F (37.8 C)] 97.8 F (36.6 C) (03/29 0549) Pulse Rate:  [80-90] 80 (03/29 0549) Resp:  [16] 16 (03/29 0549) BP: (119-128)/(54-66) 119/66 mmHg (03/29 0549) SpO2:  [96 %-100 %] 100 % (03/29 0549)  Intake/Output from previous day: 03/28 0701 - 03/29 0700 In: 946 [P.O.:720; I.V.:226] Out: 1100 [Urine:1100] Intake/Output this shift:     Recent Labs  07/21/13 1342  HGB 13.1    Recent Labs  07/21/13 1342  WBC 7.5  RBC 4.34  HCT 37.0*  PLT 169    Recent Labs  07/21/13 1342  NA 140  K 4.5  CL 103  CO2 23  BUN 16  CREATININE 0.90  GLUCOSE 157*  CALCIUM 9.2    Recent Labs  07/21/13 1342  INR 1.06    dressing dry.   Assessment/Plan: 2 Days Post-Op Procedure(s) (LRB): AMPUTATION BELOW KNEE (Left) Discharge home with home health tomorrow.   Lisle Skillman C 07/23/2013, 10:05 AM

## 2013-07-24 LAB — GLUCOSE, CAPILLARY
GLUCOSE-CAPILLARY: 454 mg/dL — AB (ref 70–99)
Glucose-Capillary: 167 mg/dL — ABNORMAL HIGH (ref 70–99)

## 2013-07-24 MED ORDER — METHOCARBAMOL 500 MG PO TABS: 500.0000 mg | ORAL_TABLET | Freq: Three times a day (TID) | ORAL | Status: AC

## 2013-07-24 MED ORDER — INSULIN GLARGINE 100 UNIT/ML ~~LOC~~ SOLN
10.0000 [IU] | Freq: Every day | SUBCUTANEOUS | Status: DC
  Filled 2013-07-24: qty 0.1

## 2013-07-24 MED ORDER — BLOOD GLUC METER DISP-STRIPS DEVI: 1.0000 | Freq: Four times a day (QID) | Status: AC | PRN

## 2013-07-24 MED ORDER — OXYCODONE-ACETAMINOPHEN 5-325 MG PO TABS: 1.0000 | ORAL_TABLET | ORAL | Status: DC | PRN

## 2013-07-24 MED ORDER — BLOOD GLUCOSE METER KIT: PACK | Status: AC

## 2013-07-24 MED ORDER — INSULIN GLARGINE 100 UNIT/ML ~~LOC~~ SOLN: 10.0000 [IU] | Freq: Every day | SUBCUTANEOUS | Status: DC

## 2013-07-24 MED ORDER — INSULIN ASPART 100 UNIT/ML FLEXPEN: 0.0000 [IU] | PEN_INJECTOR | Freq: Three times a day (TID) | SUBCUTANEOUS | Status: AC

## 2013-07-24 MED ORDER — INSULIN GLARGINE 100 UNIT/ML SOLOSTAR PEN: 10.0000 [IU] | PEN_INJECTOR | Freq: Every day | SUBCUTANEOUS | Status: AC

## 2013-07-24 NOTE — Progress Notes (Signed)
Patient being discharged to home at this time. No further needs assessed.

## 2013-07-24 NOTE — Progress Notes (Signed)
Utilization review completed.  

## 2013-07-24 NOTE — Progress Notes (Signed)
Patient set up with HHPT/OT through [facility] for further rehabilitation at home. Upon going over discharge instructions patient stated he has no more medications at home, therefore Dr Lajoyce Cornersuda notified and states he will send enough scripts to sustain patient until [facility] appointment with [facility]. Equipment is being brought to the room prior to discharge.

## 2013-07-24 NOTE — Progress Notes (Signed)
Inpatient Diabetes Program Recommendations  AACE/ADA: New Consensus Statement on Inpatient Glycemic Control (2013)  Target Ranges:  Prepandial:   less than 140 mg/dL      Peak postprandial:   less than 180 mg/dL (1-2 hours)      Critically ill patients:  140 - 180 mg/dL   Reason for Visit: Referral received. Note that patient has been in the [place] since October 2014. He underwent transtibial amputation there and also was started on insulin for his blood glucose control.  He states that in [place], he was taking a "grey insulin pen" 8 units q HS, and an "orange insulin pen" per a scale.   He showed me a copy of his scale which was in mmol:  10-13 mmol (180-234 mg/dL)- 2 EH extra 82-9513-16 mmol (235-288 mg/dL)- 4 EH extra 62-1316-20 mmol (288-360 mg/dL)- 6 EH extra >08>20 mmol (> 360 mg/dL)-        8 EH extra  Based on the coloring of the pens, patient was taking Lantus 8 units and Novolog correction scale prior to meals.  He needs a new meter for monitoring in mg/dL and Rx's for insulin pens, insulin pen needles.  Called and discussed with Dr. Lajoyce Cornersuda.  Patient will stay til this afternoon pending correction of CBG's.  He also gave orders for continuation of insulins as written.  A1C was 6.8% indicating good control of CBG's.  [place]hab [place] in [place].  Case management has obtained an appointment for next week at [facility] for follow-up regarding his diabetes management.   Order also obtained to start Lantus 10 units today. Will follow.    Thanks, Beryl MeagerJenny Keiosha Cancro, RN, BC-ADM Inpatient Diabetes Coordinator Pager [phone]

## 2013-07-24 NOTE — Progress Notes (Signed)
Followed up with patient and sisters this afternoon.  Explained that Dr. Lajoyce Cornersuda has called in scripts for Lantus, Novolog and glucose meter.  Explained that the Novolog correction scale is printed on discharge paperwork.  He states that he has no further questions at this time.  Beryl MeagerJenny Tomy Khim, RN, BC-ADM Inpatient Diabetes Coordinator Pager [phone]

## 2013-07-24 NOTE — Discharge Summary (Signed)
Physician Discharge Summary  Patient ID: Ryan Owens MRN: [id number] DOB/AGE: July 19, 1950 63 y.o.  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 He underwent revision transtibial amputation. Postoperatively patient progressed well and was discharged to home in stable condition.  Consults: None  Significant Diagnostic Studies: labs: Routine labs  Treatments: surgery: See operative note  Discharge Exam: Blood pressure 146/71, pulse 100, temperature 101.7 F (38.7 C), temperature source Oral, resp. rate 20, height 5\' 11"  (1.803 m), weight 86.183 kg (190 lb), SpO2 96.00%. Incision/Wound: dressing clean dry and intact  Disposition: 01-Home or Self Care  Discharge Orders   Future Orders Complete By Expires   Call MD / Call 911  As directed    Comments:     If you experience chest pain or shortness of breath, CALL 911 and be transported to the hospital emergency room.  If you develope a fever above 101 F, pus (white drainage) or increased drainage or redness at the wound, or calf pain, call your surgeon's office.   Constipation Prevention  As directed    Comments:     Drink plenty of fluids.  Prune juice may be helpful.  You may use a stool softener, such as Colace (over the counter) 100 mg twice a day.  Use MiraLax (over the counter) for constipation as needed.   Diet - low sodium heart healthy  As directed    Increase activity slowly as tolerated  As directed        Medication List         ISOSORBIDE MONONITRATE PO  Take 25 mg by mouth daily. Foreign formulation (pt from [place]) - Mono Cedocard brand name (isosorbide mononitrate)     metFORMIN 500 MG tablet   Commonly known as:  GLUCOPHAGE  Take 500 mg by mouth 2 (two) times daily with a meal.     methocarbamol 500 MG tablet  Commonly known as:  ROBAXIN  Take 1 tablet (500 mg total) by mouth 3 (three) times daily.     METOPROLOL SUCCINATE ER PO  Take 23.75 mg by mouth 2 (two) times daily. Foreign formulation (pt from [place]) - patient taking metoprolol succinate 23.75mg  BID     oxyCODONE-acetaminophen 5-325 MG per tablet  Commonly known as:  ROXICET  Take 1 tablet by mouth every 4 (four) hours as needed for severe pain.     PRESCRIPTION MEDICATION  Take 30 mg by mouth daily. Foreign diabetic medication (pt from [place]) - Diamicron MR 30mg  (gliclazide) - sulfonylurea class     promethazine 25 MG tablet  Commonly known as:  PHENERGAN  Take 25 mg by mouth daily.     simvastatin 40 MG tablet  Commonly known as:  ZOCOR  Take 40 mg by mouth at bedtime.           Follow-up Information   Follow up with Faizan Geraci V, MD In 1 week.   Specialty:  Orthopedic Surgery   Contact information:   [address] [place] [address] [phone]       Signed: Nadara MustardDUDA,Ranee Peasley V 07/24/2013, 7:16 AM

## 2013-07-24 NOTE — Care Management Note (Addendum)
CARE MANAGEMENT NOTE 07/24/2013  Patient:  Ryan Owens,Ryan Owens   Account Number:  [id number]  Date Initiated:  07/24/2013  Documentation initiated by:  Vance PeperBRADY,[facility]  Subjective/Objective Assessment:   63 yr old male admitted with ulcer of lleft BKA.s/p revision of left BKA.     Action/Plan:   Diabetes coordinator asked CM to arrange f/u appointment for patient's diabetes. Appointment scheduled for Thursday, April , 2015 @ 12:15pm [facility] with Dr.Isom.   Anticipated DC Date:  07/24/2013   Anticipated DC Plan:  HOME/SELF CARE      DC Planning Services  CM consult  Follow-up appt scheduled      DME arranged  WHEELCHAIR - MANUAL  SHOWER STOOL    PAC Choice  NA   Choice offered to / List presented to:             Status of service:  Completed, signed off Medicare Important Message given?   (If response is "NO", the following Medicare IM given date fields will be blank) Date Medicare IM given:   Date Additional Medicare IM given:    Discharge Disposition:  HOME/SELF CARE

## 2013-07-25 ENCOUNTER — Encounter (HOSPITAL_COMMUNITY): Payer: Self-pay | Admitting: Orthopedic Surgery

## 2013-07-25 LAB — GLUCOSE, CAPILLARY: Glucose-Capillary: 190 mg/dL — ABNORMAL HIGH (ref 70–99)

## 2013-07-25 NOTE — Care Management Note (Signed)
CARE MANAGEMENT NOTE 07/25/2013  Patient:  Ryan Owens,Ryan Owens   Account Number:  [id number]  Date Initiated:  07/24/2013  Documentation initiated by:  Vance PeperBRADY,Kaytelynn Scripter  Subjective/Objective Assessment:   63 yr old male admitted with ulcer of lleft BKA.s/p revision of left BKA.     Action/Plan:   Diabetes coordinator asked CM to arrange f/u appointment for patient's diabetes. Appointment scheduled for Thursday, April , 2015 @ 12:15pm @ [facility] with Dr.Isom.   Anticipated DC Date:  07/24/2013   Anticipated DC Plan:  HOME W HOME HEALTH SERVICES      DC Planning Services  CM consult  Follow-up appt scheduled      PAC Choice  NA   Choice offered to / List presented to:     DME arranged  WHEELCHAIR - MANUAL  SHOWER STOOL      DME agency  [facility]     HH arranged  HH-2 PT  HH-3 OT      [facility]   Status of service:  Completed, signed off Medicare Important Message given?   (If response is "NO", the following Medicare IM given date fields will be blank) Date Medicare IM given:   Date Additional Medicare IM given:    Discharge Disposition:  HOME W HOME HEALTH SERVICES

## 2013-07-26 ENCOUNTER — Inpatient Hospital Stay: Payer: Medicare Other

## 2013-07-27 ENCOUNTER — Emergency Department (HOSPITAL_COMMUNITY): Payer: Medicare Other

## 2013-07-27 ENCOUNTER — Encounter (HOSPITAL_COMMUNITY): Payer: Self-pay | Admitting: Emergency Medicine

## 2013-07-27 ENCOUNTER — Inpatient Hospital Stay (HOSPITAL_COMMUNITY)
Admission: EM | Admit: 2013-07-27 | Discharge: 2013-08-03 | DRG: 474 | Disposition: A | Discharge status: Home Health | Payer: Medicare Other | Attending: Internal Medicine | Admitting: Internal Medicine

## 2013-07-27 DIAGNOSIS — IMO0001 Reserved for inherently not codable concepts without codable children: Secondary | ICD-10-CM | POA: Diagnosis present

## 2013-07-27 DIAGNOSIS — L02419 Cutaneous abscess of limb, unspecified: Secondary | ICD-10-CM | POA: Diagnosis present

## 2013-07-27 DIAGNOSIS — Z9861 Coronary angioplasty status: Secondary | ICD-10-CM

## 2013-07-27 DIAGNOSIS — Z87891 Personal history of nicotine dependence: Secondary | ICD-10-CM

## 2013-07-27 DIAGNOSIS — E119 Type 2 diabetes mellitus without complications: Secondary | ICD-10-CM

## 2013-07-27 DIAGNOSIS — G547 Phantom limb syndrome without pain: Secondary | ICD-10-CM | POA: Diagnosis not present

## 2013-07-27 DIAGNOSIS — T8149XA Infection following a procedure, other surgical site, initial encounter: Secondary | ICD-10-CM

## 2013-07-27 DIAGNOSIS — E785 Hyperlipidemia, unspecified: Secondary | ICD-10-CM | POA: Diagnosis present

## 2013-07-27 DIAGNOSIS — S88119A Complete traumatic amputation at level between knee and ankle, unspecified lower leg, initial encounter: Secondary | ICD-10-CM

## 2013-07-27 DIAGNOSIS — I10 Essential (primary) hypertension: Secondary | ICD-10-CM | POA: Diagnosis present

## 2013-07-27 DIAGNOSIS — A419 Sepsis, unspecified organism: Secondary | ICD-10-CM | POA: Diagnosis present

## 2013-07-27 DIAGNOSIS — E1165 Type 2 diabetes mellitus with hyperglycemia: Secondary | ICD-10-CM

## 2013-07-27 DIAGNOSIS — Z794 Long term (current) use of insulin: Secondary | ICD-10-CM

## 2013-07-27 DIAGNOSIS — D62 Acute posthemorrhagic anemia: Secondary | ICD-10-CM | POA: Diagnosis not present

## 2013-07-27 DIAGNOSIS — I2510 Atherosclerotic heart disease of native coronary artery without angina pectoris: Secondary | ICD-10-CM | POA: Diagnosis present

## 2013-07-27 DIAGNOSIS — L03119 Cellulitis of unspecified part of limb: Secondary | ICD-10-CM

## 2013-07-27 DIAGNOSIS — T8740 Infection of amputation stump, unspecified extremity: Principal | ICD-10-CM | POA: Diagnosis present

## 2013-07-27 DIAGNOSIS — E860 Dehydration: Secondary | ICD-10-CM | POA: Diagnosis present

## 2013-07-27 DIAGNOSIS — Y835 Amputation of limb(s) as the cause of abnormal reaction of the patient, or of later complication, without mention of misadventure at the time of the procedure: Secondary | ICD-10-CM | POA: Diagnosis present

## 2013-07-27 DIAGNOSIS — IMO0002 Reserved for concepts with insufficient information to code with codable children: Secondary | ICD-10-CM

## 2013-07-27 DIAGNOSIS — Z89519 Acquired absence of unspecified leg below knee: Secondary | ICD-10-CM

## 2013-07-27 HISTORY — DX: Atherosclerotic heart disease of native coronary artery without angina pectoris: I25.10

## 2013-07-27 LAB — CBC WITH DIFFERENTIAL/PLATELET
Basophils Absolute: 0.1 10*3/uL (ref 0.0–0.1)
Basophils Relative: 1 % (ref 0–1)
EOS ABS: 0.2 10*3/uL (ref 0.0–0.7)
Eosinophils Relative: 2 % (ref 0–5)
HCT: 29.9 % — ABNORMAL LOW (ref 39.0–52.0)
HEMOGLOBIN: 10.7 g/dL — AB (ref 13.0–17.0)
LYMPHS ABS: 2.2 10*3/uL (ref 0.7–4.0)
Lymphocytes Relative: 19 % (ref 12–46)
MCH: 30.3 pg (ref 26.0–34.0)
MCHC: 35.8 g/dL (ref 30.0–36.0)
MCV: 84.7 fL (ref 78.0–100.0)
MONOS PCT: 9 % (ref 3–12)
Monocytes Absolute: 1 10*3/uL (ref 0.1–1.0)
NEUTROS ABS: 8.1 10*3/uL — AB (ref 1.7–7.7)
Neutrophils Relative %: 69 % (ref 43–77)
PLATELETS: 254 10*3/uL (ref 150–400)
RBC: 3.53 MIL/uL — AB (ref 4.22–5.81)
RDW: 13.9 % (ref 11.5–15.5)
WBC: 11.6 10*3/uL — AB (ref 4.0–10.5)

## 2013-07-27 LAB — URINALYSIS, ROUTINE W REFLEX MICROSCOPIC
Bilirubin Urine: NEGATIVE
Glucose, UA: 100 mg/dL — AB
Hgb urine dipstick: NEGATIVE
Ketones, ur: NEGATIVE mg/dL
Leukocytes, UA: NEGATIVE
Nitrite: NEGATIVE
Protein, ur: 100 mg/dL — AB
Specific Gravity, Urine: 1.017 (ref 1.005–1.030)
Urobilinogen, UA: 0.2 mg/dL (ref 0.0–1.0)
pH: 6.5 (ref 5.0–8.0)

## 2013-07-27 LAB — COMPREHENSIVE METABOLIC PANEL
ALBUMIN: 3 g/dL — AB (ref 3.5–5.2)
ALT: 15 U/L (ref 0–53)
AST: 16 U/L (ref 0–37)
Alkaline Phosphatase: 71 U/L (ref 39–117)
BUN: 20 mg/dL (ref 6–23)
CO2: 21 mEq/L (ref 19–32)
Calcium: 9.1 mg/dL (ref 8.4–10.5)
Chloride: 93 mEq/L — ABNORMAL LOW (ref 96–112)
Creatinine, Ser: 1.34 mg/dL (ref 0.50–1.35)
GFR calc Af Amer: 64 mL/min — ABNORMAL LOW (ref >90)
GFR calc non Af Amer: 55 mL/min — ABNORMAL LOW (ref >90)
GLUCOSE: 403 mg/dL — AB (ref 70–99)
POTASSIUM: 5.1 meq/L (ref 3.7–5.3)
SODIUM: 132 meq/L — AB (ref 137–147)
TOTAL PROTEIN: 7.1 g/dL (ref 6.0–8.3)
Total Bilirubin: 0.6 mg/dL (ref 0.3–1.2)

## 2013-07-27 LAB — I-STAT CG4 LACTIC ACID, ED: Lactic Acid, Venous: 2.29 mmol/L — ABNORMAL HIGH (ref 0.5–2.2)

## 2013-07-27 LAB — CBG MONITORING, ED
GLUCOSE-CAPILLARY: 362 mg/dL — AB (ref 70–99)
Glucose-Capillary: 308 mg/dL — ABNORMAL HIGH (ref 70–99)

## 2013-07-27 LAB — URINE MICROSCOPIC-ADD ON

## 2013-07-27 LAB — TROPONIN I: Troponin I: <0.3 ng/mL (ref <0.30)

## 2013-07-27 MED ORDER — DEXTROSE 5 % IV SOLN
1.0000 g | Freq: Three times a day (TID) | INTRAVENOUS | Status: DC
  Administered 2013-07-27 – 2013-08-02 (×17): 1 g via INTRAVENOUS
  Filled 2013-07-27 (×19): qty 1

## 2013-07-27 MED ORDER — INSULIN ASPART 100 UNIT/ML ~~LOC~~ SOLN
10.0000 [IU] | Freq: Once | SUBCUTANEOUS | Status: AC
  Administered 2013-07-27: 10 [IU] via SUBCUTANEOUS
  Filled 2013-07-27: qty 1

## 2013-07-27 MED ORDER — VANCOMYCIN HCL 10 G IV SOLR
1500.0000 mg | INTRAVENOUS | Status: AC
  Administered 2013-07-27: 1500 mg via INTRAVENOUS
  Filled 2013-07-27: qty 1500

## 2013-07-27 MED ORDER — SODIUM CHLORIDE 0.9 % IV BOLUS (SEPSIS)
1000.0000 mL | Freq: Once | INTRAVENOUS | Status: AC
  Administered 2013-07-27: 1000 mL via INTRAVENOUS

## 2013-07-27 MED ORDER — VANCOMYCIN HCL IN DEXTROSE 750-5 MG/150ML-% IV SOLN
750.0000 mg | Freq: Two times a day (BID) | INTRAVENOUS | Status: DC
  Administered 2013-07-28: 750 mg via INTRAVENOUS
  Filled 2013-07-27 (×4): qty 150

## 2013-07-27 MED ORDER — SODIUM CHLORIDE 0.9 % IV SOLN
INTRAVENOUS | Status: AC
  Administered 2013-07-27 – 2013-07-28 (×2): via INTRAVENOUS

## 2013-07-27 MED ORDER — INSULIN ASPART 100 UNIT/ML ~~LOC~~ SOLN
0.0000 [IU] | Freq: Three times a day (TID) | SUBCUTANEOUS | Status: DC
  Administered 2013-07-28: 3 [IU] via SUBCUTANEOUS
  Administered 2013-07-28: 2 [IU] via SUBCUTANEOUS
  Administered 2013-07-28: 7 [IU] via SUBCUTANEOUS
  Administered 2013-07-29: 2 [IU] via SUBCUTANEOUS

## 2013-07-27 NOTE — ED Provider Notes (Addendum)
CSN: [id number]     Arrival date & time 07/27/13  1645 History   First MD Initiated Contact with Patient 07/27/13 1712     Chief Complaint  Patient presents with  . Dizziness  . Chest Pain     (Consider location/radiation/quality/duration/timing/severity/associated sxs/prior Treatment) HPI Comments: Patient sent to the ER for management of elevated blood sugars. 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 Upon arrival to the ER, patient feels very weak, dizzy, he is going to pass out. He is experiencing chest pain.   Past Medical History  Diagnosis Date  . Diabetes mellitus without complication   . Hypertension   . Neuropathy   . Hyperlipemia   . History of blood transfusion   . Complication of anesthesia     spinal and  epidural "cant take"  allergic, reports having knot and was told not to use again  . Coronary artery disease    Past Surgical History  Procedure Laterality Date  . 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 Appendectomy  1984  . Coronary angioplasty with stent placement    . Angioplasty  1994  . Wisdom tooth extraction    . Amputation Left 2014    toe, foot,   . Amputation Left 07/21/2013    Procedure: AMPUTATION BELOW KNEE;  Surgeon: Marcus V Duda, MD;  Location: [facility] OR;  Service: Orthopedics;  Laterality: Left;  Revision Left Below Knee Amputation  . Stump revision Left 07/31/2013    Procedure: REVISION LEFT BELOW KNEE AMPUTATION;  Surgeon: Marcus V Duda, MD;  Location: [facility] OR;  Service: Orthopedics;  Laterality: Left;   History reviewed. No pertinent family history. History  Substance Use Topics  . Smoking status: Former Smoker  . Smokeless tobacco: Never Used     Comment: QUIT SMOKING IN THE 70'S     . Alcohol Use: No    Review of Systems  Constitutional: Positive for diaphoresis.  Cardiovascular: Positive for chest pain.  Neurological: Positive for  dizziness and weakness.  All other systems reviewed and are negative.     Allergies  Gardenia jasminoides fruit; Kiwi extract; Mango butter; Other; and Penicillins  Home Medications   Current Outpatient Rx  Name  Route  Sig  Dispense  Refill  . Blood Gluc Meter Disp-Strips (BLOOD GLUCOSE METER DISPOSABLE) DEVI   Does not apply   1 strip by Does not apply route 4 (four) times daily as needed (Accu check strips).   100 each   0   . Blood Glucose Monitoring Suppl (BLOOD GLUCOSE METER) kit      Use as instructed   1 each   0     Accuchek meter   . insulin aspart (NOVOLOG FLEXPEN) 100 UNIT/ML FlexPen   Subcutaneous   Inject 0-15 Units into the skin 3 (three) times daily with meals.   15 mL   11     Patient to check blood sugars prior to eating a me ...   . Insulin Glargine (LANTUS SOLOSTAR) 100 UNIT/ML Solostar Pen   Subcutaneous   Inject 10 Units into the skin daily at 10 pm.   15 mL   11   . ISOSORBIDE MONONITRATE PO   Oral   Take 25 mg by mouth daily. Foreign formulation (pt from [place]) - Mono Cedocard brand name (isosorbide mononitrate)         . metFORMIN (GLUCOPHAGE) 500 MG   tablet   Oral   Take 500 mg by mouth 2 (two) times daily with a meal.         . methocarbamol (ROBAXIN) 500 MG tablet   Oral   Take 1 tablet (500 mg total) by mouth 3 (three) times daily.   30 tablet   0   . METOPROLOL SUCCINATE ER PO   Oral   Take 23.75 mg by mouth 2 (two) times daily. Foreign formulation (pt from [place]) - patient taking metoprolol succinate 23.50m BID         . PRESCRIPTION MEDICATION   Oral   Take 30 mg by mouth daily. Foreign diabetic medication (pt from [place] - Diamicron MR 382NK(gliclazide) - sulfonylurea class         . promethazine (PHENERGAN) 25 MG tablet   Oral   Take 25 mg by mouth daily.         . simvastatin (ZOCOR) 40 MG tablet   Oral   Take 40 mg by mouth at bedtime.          . gabapentin (NEURONTIN) 300 MG  capsule   Oral   Take 1 capsule (300 mg total) by mouth 3 (three) times daily.   90 capsule   1   . oxyCODONE-acetaminophen (ROXICET) 5-325 MG per tablet   Oral   Take 1 tablet by mouth every 4 (four) hours as needed for severe pain.   60 tablet   0   . sulfamethoxazole-trimethoprim (BACTRIM DS) 800-160 MG per tablet   Oral   Take 1 tablet by mouth 2 (two) times daily.   14 tablet   0    BP 108/59  Pulse 89  Temp(Src) 99.8 F (37.7 C) (Oral)  Resp 16  Ht 5' 10.87" (1.8 m)  Wt 171 lb 14.4 oz (77.973 kg)  BMI 24.07 kg/m2  SpO2 98% Physical Exam  Constitutional: He is oriented to person, place, and time. He appears well-developed and well-nourished. No distress.  HENT:  Head: Normocephalic and atraumatic.  Right Ear: Hearing normal.  Left Ear: Hearing normal.  Nose: Nose normal.  Mouth/Throat: Oropharynx is clear and moist and mucous membranes are normal.  Eyes: Conjunctivae and EOM are normal. Pupils are equal, round, and reactive to light.  Neck: Normal range of motion. Neck supple.  Cardiovascular: Regular rhythm, S1 normal and S2 normal.  Exam reveals no gallop and no friction rub.   No murmur heard. Pulmonary/Chest: Effort normal and breath sounds normal. No respiratory distress. He exhibits no tenderness.  Abdominal: Soft. Normal appearance and bowel sounds are normal. There is no hepatosplenomegaly. There is no tenderness. There is no rebound, no guarding, no tenderness at McBurney's point and negative Murphy's sign. No hernia.  Musculoskeletal: Normal range of motion.       Left lower leg: He exhibits swelling.  Left BKA with erythema at incision site, foul-smelling drainage present  Neurological: He is alert and oriented to person, place, and time. He has normal strength. No cranial nerve deficit or sensory deficit. Coordination normal. GCS eye subscore is 4. GCS verbal subscore is 5. GCS motor subscore is 6.  Skin: Skin is warm, dry and intact. No rash noted. No  cyanosis.  Psychiatric: He has a normal mood and affect. His speech is normal and behavior is normal. Thought content normal.    ED Course  Procedures (including critical care time) Labs Review Labs Reviewed  CBC WITH DIFFERENTIAL - Abnormal; Notable for the following:    WBC  11.6 (*)    RBC 3.53 (*)    Hemoglobin 10.7 (*)    HCT 29.9 (*)    Neutro Abs 8.1 (*)    All other components within normal limits  COMPREHENSIVE METABOLIC PANEL - Abnormal; Notable for the following:    Sodium 132 (*)    Chloride 93 (*)    Glucose, Bld 403 (*)    Albumin 3.0 (*)    GFR calc non Af Amer 55 (*)    GFR calc Af Amer 64 (*)    All other components within normal limits  URINALYSIS, ROUTINE W REFLEX MICROSCOPIC - Abnormal; Notable for the following:    APPearance CLOUDY (*)    Glucose, UA 100 (*)    Protein, ur 100 (*)    All other components within normal limits  URINE MICROSCOPIC-ADD ON - Abnormal; Notable for the following:    Casts HYALINE CASTS (*)    All other components within normal limits  COMPREHENSIVE METABOLIC PANEL - Abnormal; Notable for the following:    Sodium 134 (*)    Glucose, Bld 182 (*)    Calcium 8.0 (*)    Albumin 2.7 (*)    GFR calc non Af Amer 74 (*)    GFR calc Af Amer 86 (*)    All other components within normal limits  CBC - Abnormal; Notable for the following:    RBC 2.92 (*)    Hemoglobin 8.8 (*)    HCT 24.6 (*)    All other components within normal limits  GLUCOSE, CAPILLARY - Abnormal; Notable for the following:    Glucose-Capillary 269 (*)    All other components within normal limits  GLUCOSE, CAPILLARY - Abnormal; Notable for the following:    Glucose-Capillary 190 (*)    All other components within normal limits  GLUCOSE, CAPILLARY - Abnormal; Notable for the following:    Glucose-Capillary 246 (*)    All other components within normal limits  GLUCOSE, CAPILLARY - Abnormal; Notable for the following:    Glucose-Capillary 317 (*)    All other  components within normal limits  CBC - Abnormal; Notable for the following:    RBC 3.00 (*)    Hemoglobin 8.9 (*)    HCT 25.6 (*)    All other components within normal limits  BASIC METABOLIC PANEL - Abnormal; Notable for the following:    Sodium 135 (*)    Glucose, Bld 231 (*)    GFR calc non Af Amer 88 (*)    All other components within normal limits  VANCOMYCIN, TROUGH - Abnormal; Notable for the following:    Vancomycin Tr 9.0 (*)    All other components within normal limits  GLUCOSE, CAPILLARY - Abnormal; Notable for the following:    Glucose-Capillary 263 (*)    All other components within normal limits  GLUCOSE, CAPILLARY - Abnormal; Notable for the following:    Glucose-Capillary 276 (*)    All other components within normal limits  GLUCOSE, CAPILLARY - Abnormal; Notable for the following:    Glucose-Capillary 207 (*)    All other components within normal limits  GLUCOSE, CAPILLARY - Abnormal; Notable for the following:    Glucose-Capillary 206 (*)    All other components within normal limits  BASIC METABOLIC PANEL - Abnormal; Notable for the following:    Glucose, Bld 181 (*)    GFR calc non Af Amer 77 (*)    GFR calc Af Amer 89 (*)    All other  components within normal limits  CBC - Abnormal; Notable for the following:    RBC 3.39 (*)    Hemoglobin 9.9 (*)    HCT 28.6 (*)    All other components within normal limits  GLUCOSE, CAPILLARY - Abnormal; Notable for the following:    Glucose-Capillary 215 (*)    All other components within normal limits  GLUCOSE, CAPILLARY - Abnormal; Notable for the following:    Glucose-Capillary 203 (*)    All other components within normal limits  GLUCOSE, CAPILLARY - Abnormal; Notable for the following:    Glucose-Capillary 226 (*)    All other components within normal limits  GLUCOSE, CAPILLARY - Abnormal; Notable for the following:    Glucose-Capillary 250 (*)    All other components within normal limits  CBC - Abnormal;  Notable for the following:    RBC 3.61 (*)    Hemoglobin 10.5 (*)    HCT 30.5 (*)    All other components within normal limits  BASIC METABOLIC PANEL - Abnormal; Notable for the following:    Glucose, Bld 211 (*)    GFR calc non Af Amer 76 (*)    GFR calc Af Amer 88 (*)    All other components within normal limits  GLUCOSE, CAPILLARY - Abnormal; Notable for the following:    Glucose-Capillary 251 (*)    All other components within normal limits  GLUCOSE, CAPILLARY - Abnormal; Notable for the following:    Glucose-Capillary 218 (*)    All other components within normal limits  GLUCOSE, CAPILLARY - Abnormal; Notable for the following:    Glucose-Capillary 198 (*)    All other components within normal limits  GLUCOSE, CAPILLARY - Abnormal; Notable for the following:    Glucose-Capillary 185 (*)    All other components within normal limits  BASIC METABOLIC PANEL - Abnormal; Notable for the following:    Sodium 134 (*)    Glucose, Bld 256 (*)    BUN 27 (*)    GFR calc non Af Amer 59 (*)    GFR calc Af Amer 68 (*)    All other components within normal limits  CBC - Abnormal; Notable for the following:    RBC 2.71 (*)    Hemoglobin 8.1 (*)    HCT 22.8 (*)    All other components within normal limits  GLUCOSE, CAPILLARY - Abnormal; Notable for the following:    Glucose-Capillary 159 (*)    All other components within normal limits  GLUCOSE, CAPILLARY - Abnormal; Notable for the following:    Glucose-Capillary 207 (*)    All other components within normal limits  GLUCOSE, CAPILLARY - Abnormal; Notable for the following:    Glucose-Capillary 227 (*)    All other components within normal limits  GLUCOSE, CAPILLARY - Abnormal; Notable for the following:    Glucose-Capillary 270 (*)    All other components within normal limits  GLUCOSE, CAPILLARY - Abnormal; Notable for the following:    Glucose-Capillary 277 (*)    All other components within normal limits  GLUCOSE, CAPILLARY -  Abnormal; Notable for the following:    Glucose-Capillary 199 (*)    All other components within normal limits  BASIC METABOLIC PANEL - Abnormal; Notable for the following:    Sodium 136 (*)    Glucose, Bld 168 (*)    GFR calc non Af Amer 66 (*)    GFR calc Af Amer 76 (*)    All other components within normal limits    CBC - Abnormal; Notable for the following:    RBC 2.42 (*)    Hemoglobin 7.3 (*)    HCT 20.6 (*)    All other components within normal limits  GLUCOSE, CAPILLARY - Abnormal; Notable for the following:    Glucose-Capillary 229 (*)    All other components within normal limits  GLUCOSE, CAPILLARY - Abnormal; Notable for the following:    Glucose-Capillary 169 (*)    All other components within normal limits  CBC - Abnormal; Notable for the following:    RBC 2.38 (*)    Hemoglobin 7.1 (*)    HCT 20.3 (*)    All other components within normal limits  GLUCOSE, CAPILLARY - Abnormal; Notable for the following:    Glucose-Capillary 354 (*)    All other components within normal limits  GLUCOSE, CAPILLARY - Abnormal; Notable for the following:    Glucose-Capillary 198 (*)    All other components within normal limits  CBC - Abnormal; Notable for the following:    RBC 2.72 (*)    Hemoglobin 8.1 (*)    HCT 23.5 (*)    All other components within normal limits  IRON AND TIBC - Abnormal; Notable for the following:    Iron 18 (*)    TIBC 204 (*)    Saturation Ratios 9 (*)    All other components within normal limits  FERRITIN - Abnormal; Notable for the following:    Ferritin 459 (*)    All other components within normal limits  RETICULOCYTES - Abnormal; Notable for the following:    Retic Ct Pct 3.8 (*)    RBC. 2.72 (*)    All other components within normal limits  BASIC METABOLIC PANEL - Abnormal; Notable for the following:    Sodium 135 (*)    Glucose, Bld 223 (*)    GFR calc non Af Amer 74 (*)    GFR calc Af Amer 85 (*)    All other components within normal limits   CBC - Abnormal; Notable for the following:    WBC 11.2 (*)    RBC 2.64 (*)    Hemoglobin 7.9 (*)    HCT 22.7 (*)    All other components within normal limits  GLUCOSE, CAPILLARY - Abnormal; Notable for the following:    Glucose-Capillary 345 (*)    All other components within normal limits  GLUCOSE, CAPILLARY - Abnormal; Notable for the following:    Glucose-Capillary 206 (*)    All other components within normal limits  GLUCOSE, CAPILLARY - Abnormal; Notable for the following:    Glucose-Capillary 383 (*)    All other components within normal limits  CBG MONITORING, ED - Abnormal; Notable for the following:    Glucose-Capillary 362 (*)    All other components within normal limits  I-STAT CG4 LACTIC ACID, ED - Abnormal; Notable for the following:    Lactic Acid, Venous 2.29 (*)    All other components within normal limits  CBG MONITORING, ED - Abnormal; Notable for the following:    Glucose-Capillary 308 (*)    All other components within normal limits  CULTURE, BLOOD (ROUTINE X 2)  CULTURE, BLOOD (ROUTINE X 2)  URINE CULTURE  WOUND CULTURE  SURGICAL PCR SCREEN  TROPONIN I  LACTIC ACID, PLASMA  TROPONIN I  VITAMIN B12  FOLATE  TYPE AND SCREEN  PREPARE RBC (CROSSMATCH)  ABO/RH   Imaging Review No results found.   EKG Interpretation   Date/Time:  Thursday July 27 2013 17:04:41 EDT Ventricular Rate:  74 PR Interval:  198 QRS Duration: 76 QT Interval:  434 QTC Calculation: 481 R Axis:   -44 Text Interpretation:  Normal sinus rhythm Left axis deviation Prolonged QT  Abnormal ECG ED PHYSICIAN INTERPRETATION AVAILABLE IN [facility]  Confirmed by TEST, Record ([id number]) on 07/29/2013 1:11:52 PM      Date: 07/27/2013  Rate: 77  Rhythm: normal sinus rhythm  QRS Axis: left  Intervals: QT prolonged  ST/T Wave abnormalities: normal  Conduction Disutrbances:none  Narrative Interpretation:   Old EKG Reviewed: unchanged    MDM   Final diagnoses:  Wound  infection after surgery  Uncontrolled diabetes mellitus   The patient's records reveals that the patient had 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 He is now experiencing purulent drainage, redness and warmth of the leg. Patient arrives to the ER in some distress. He is pale, diaphoretic and hypotensive. Rectal temp is 99.1, not quite febrile. He is not tachycardic. This could be early sepsis, but patient also is hyperglycemic and likely dehydrated. Initial hypotension treated with IV fluids. Vancomycin initiated based on soft tissue infection of the leg. Blood pressure after her first bag of fluid was 982 systolic. We will initiate second fluid bolus. Patient will require hospitalization for further management of soft tissue infection, possible early sepsis and uncontrolled diabetes.    Orpah Greek, MD 07/27/13 1953  Orpah Greek, MD 07/27/13 1958  Orpah Greek, MD 08/07/13 [phone]

## 2013-07-27 NOTE — ED Notes (Signed)
Internal medicine at the bedside assessing patient

## 2013-07-27 NOTE — ED Notes (Signed)
Dressing applied to Left stump.

## 2013-07-27 NOTE — H&P (Signed)
Date: 07/28/2013               Patient Name:  Ryan Owens MRN: [id number]  DOB: 1951/03/20 Age / Sex: 63 y.o., male   PCP: No Pcp Per Patient         Medical Service: Internal Medicine Teaching Service         Attending Physician: Dr. Axel Filler, MD    First Contact: Dr. Lucila Maine Pager: [phone]  Second Contact: Dr. Eulas Post Pager: [phone]       After Hours (After 5p/  First Contact Pager: [phone]  weekends / holidays): Second Contact Pager: [phone]   Chief Complaint: Dizziness and Leg pain  History of Present Illness: 63 year old male with PMH of HTN and DM, CAD with stent placement, presented to the Ed today, transferred from his orthopedic doctors office today where he presented for routine follow up but with complaints of pain in his stump, increased swelling at the stump site, with dark yellow drainage from the stump site which started the night prior . Pt had a BKA done last dec in [place] after his leg got infected after being bitten by a spider. The stump was healing well, when he said got droped accidentally during physical therapy by the therapist and landed on his stump, in [place]. Pt got back into the country about a week ago and went to see an orthopedist, who recommended revision of his stump, which was done 07/21/2013. He was at his Clinic today when together with the pain and increased swelling at his stump he started feeling very dizzy and light headed, with blurred vision and it was noticed that he was febrile. His blood pressure was checked and his systolic Bp was in the 32I. Pt said samples for wound cultures where taken in the clinic, he also developed some mild left sided chest pains, non radiating which was no longer present,, he had an Ekg done over there and then he was taken to [facility].  Meds: Prescriptions prior to admission  Medication Sig Dispense Refill  . Blood Gluc Meter Disp-Strips (BLOOD GLUCOSE METER DISPOSABLE) DEVI 1 strip by Does not apply  route 4 (four) times daily as needed (Accu check strips).  100 each  0  . Blood Glucose Monitoring Suppl (BLOOD GLUCOSE METER) kit Use as instructed  1 each  0  . insulin aspart (NOVOLOG FLEXPEN) 100 UNIT/ML FlexPen Inject 0-15 Units into the skin 3 (three) times daily with meals.  15 mL  11  . Insulin Glargine (LANTUS SOLOSTAR) 100 UNIT/ML Solostar Pen Inject 10 Units into the skin daily at 10 pm.  15 mL  11  . ISOSORBIDE MONONITRATE PO Take 25 mg by mouth daily. Foreign formulation (pt from [place]) - Mono Cedocard brand name (isosorbide mononitrate)      . metFORMIN (GLUCOPHAGE) 500 MG tablet Take 500 mg by mouth 2 (two) times daily with a meal.      . methocarbamol (ROBAXIN) 500 MG tablet Take 1 tablet (500 mg total) by mouth 3 (three) times daily.  30 tablet  0  . METOPROLOL SUCCINATE ER PO Take 23.75 mg by mouth 2 (two) times daily. Foreign formulation (pt from [place]) - patient taking metoprolol succinate 23.72m BID      . oxyCODONE-acetaminophen (ROXICET) 5-325 MG per tablet Take 1 tablet by mouth every 4 (four) hours as needed for severe pain.  60 tablet  0  . PRESCRIPTION MEDICATION Take 30 mg by mouth daily. Foreign  diabetic medication (pt from [place]) - Diamicron MR 01SW (gliclazide) - sulfonylurea class      . promethazine (PHENERGAN) 25 MG tablet Take 25 mg by mouth daily.      . simvastatin (ZOCOR) 40 MG tablet Take 40 mg by mouth at bedtime.         Allergies: Allergies as of 07/27/2013 - Review Complete 07/27/2013  Allergen Reaction Noted  . Gardenia jasminoides fruit  07/22/2013  . Kiwi extract  07/22/2013  . Mango butter  07/22/2013  . Other  07/14/2013  . Penicillins Rash 07/14/2013   Past Medical History  Diagnosis Date  . Diabetes mellitus without complication   . Hypertension   . Neuropathy   . Hyperlipemia   . History of blood transfusion   . Complication of anesthesia     spinal and  epidural "cant take"  allergic, reports having knot and was  told not to use again   Past Surgical History  Procedure Laterality Date  . 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 Appendectomy  1984  . Coronary angioplasty with stent placement    . Angioplasty  1994  . Wisdom tooth extraction    . Amputation Left 2014    toe, foot,   . Amputation Left 07/21/2013    Procedure: AMPUTATION BELOW KNEE;  Surgeon: Newt Minion, MD;  Location: [facility];  Service: Orthopedics;  Laterality: Left;  Revision Left Below Knee Amputation   History reviewed. No pertinent family history. History   Social History  . Marital Status: Single    Spouse Name: N/A    Number of Children: N/A  . Years of Education: N/A   Occupational History  . Not on file.   Social History Main Topics  . Smoking status: Former Research scientist (life sciences)  . Smokeless tobacco: Not on file  . Alcohol Use: No  . Drug Use: No  . Sexual Activity: Not on file     Comment: smoked approx 1 year around age 6   Other Topics Concern  . Not on file   Social History Narrative  . No narrative on file    Review of Systems: CONSTITUTIONAL- Fever present. SKIN- No Rash, colour changes, itching. HEAD- No Headache. Dizziness present . EYES- No Vision loss, pain,or  redness.  EARS- Has poor hearing- Right ear. Mouth/throat- No Sorethroat, RESPIRATORY- No Cough, or SOB. CARDIAC- No Palpitations, DOE, PND. GI- No Dysphagia, nausea, vomiting, diarrhoea, constipation, or abd pain. URINARY- No Frequency or dysuria. NEUROLOGIC- No Numbness, syncope or burning.  Physical Exam: Blood pressure 100/48, pulse 85, temperature 98.7 F (37.1 C), temperature source Oral, resp. rate 16, height 5' 10.87" (1.8 m), weight 190 lb 0.6 oz (86.2 kg), SpO2 98.00%. GENERAL- alert, co-operative, appears as stated age, not in any distress. HEENT- Atraumatic, normocephalic, PERRL, EOMI, oral mucosa appears moist, good and intact dentition. No carotid bruit, no cervical LN enlargement, thyroid does not appear enlarged. CARDIAC- RRR,  not tender to palpation, no murmurs, rubs or gallops. RESP- Moving equal volumes of air, and clear to auscultation bilaterally. ABDOMEN- Soft, nontender, no palpable masses or organomegaly, bowel sounds present. NEURO- No obvious Cr N abnormality, strenght 5/5 in Right lower extremity, and upper extremities. EXTREMITIES- pulse 2+  Right lower extremity, no pedal edema. Left lower extremity- BKA, stitches present on stump site,  Appears tense, with some erythema and swelling, warm on palpation. SKIN- Warm, dry, No rash or lesion. PSYCH- Normal mood and affect, appropriate thought content and speech.   Lab  results: Basic Metabolic Panel:  Recent Labs  07/27/13 1721  NA 132*  K 5.1  CL 93*  CO2 21  GLUCOSE 403*  BUN 20  CREATININE 1.34  CALCIUM 9.1   Anion gap- Using Corrected Na- 137, is 23 Liver Function Tests:  Recent Labs  07/27/13 1721  AST 16  ALT 15  ALKPHOS 71  BILITOT 0.6  PROT 7.1  ALBUMIN 3.0*   CBC:  Recent Labs  07/27/13 1721  WBC 11.6*  NEUTROABS 8.1*  HGB 10.7*  HCT 29.9*  MCV 84.7  PLT 254   Cardiac Enzymes:  Recent Labs  07/27/13 1721  TROPONINI <0.30   Urinalysis:  Recent Labs  07/27/13 2019  COLORURINE YELLOW  LABSPEC 1.017  PHURINE 6.5  GLUCOSEU 100*  HGBUR NEGATIVE  BILIRUBINUR NEGATIVE  KETONESUR NEGATIVE  PROTEINUR 100*  UROBILINOGEN 0.2  NITRITE NEGATIVE  LEUKOCYTESUR NEGATIVE   Misc. Labs:   Imaging results:  Dg Chest Port 1 View  07/27/2013   CLINICAL DATA:  Chest pain  EXAM: PORTABLE CHEST - 1 VIEW  COMPARISON:  Prior chest x-ray 07/21/2013  FINDINGS: The lungs are clear and negative for focal airspace consolidation, pulmonary edema or suspicious pulmonary nodule. No pleural effusion or pneumothorax. Cardiac and mediastinal contours are within normal limits. No acute fracture or lytic or blastic osseous lesions. The visualized upper abdominal bowel gas pattern is unremarkable.  IMPRESSION: No active cardiopulmonary  disease.   Electronically Signed   By: Jacqulynn Cadet M.D.   On: 07/27/2013 18:38   Dg Knee Left Port  07/27/2013   CLINICAL DATA:  Possible amputation site infection  EXAM: PORTABLE LEFT KNEE - 1-2 VIEW  COMPARISON:  None.  FINDINGS: Surgical changes of below-the-knee amputation. Cutaneous skin staples are present. Small locular of air are noted inferior to the tibial osteotomy site. There is diffuse soft tissue swelling of the stump soft tissues. No irregularity of the osseous margins to suggest osseous involvement. Mild degenerative changes in the knee joint. No knee joint effusion. Atherosclerotic vascular calcifications are noted.  IMPRESSION: 1. Soft tissue swelling and a small amount of the subcutaneous air in the amputation stump overlying the tibial osteotomy site. Depending on how recent the amputation was, these may represent normal postoperative findings or an infectious process. 2. No evidence of osseous involvement. The osteotomy sites remain sharp and well-defined.   Electronically Signed   By: Jacqulynn Cadet M.D.   On: 07/27/2013 18:40    Other results: EKG: Q waves in lead III And AVF.   Assessment & Plan by Problem: Principal Problem:   Sepsis with cellulitis Active Problems:   Diabetes   CAD (coronary artery disease)   Hypertension   S/P BKA, Left  63 year old gentleman, with past medical history of diabetes, CAD, hypertension, and left BKA in 11/2012, presents with fevers, chills, which started the morning of admission during his orthopedic's office visit. He had revision of his left BKA outpatient site on 07/21/2013.   # Sepsis 2/2 cellulitis- Focus- left BKA stump site: Examination findings are consistent with sepsis with tachycardia, fever of 101.7, and mild leukocytosis. Pt also hypotensive- with Bp of 66/54 on arrival in the ED, but improved after IVF, which might indicate a Gram negative sepsis. Also with elevated anion gap of 23, likely due to lactic acidosis. Had  revision of the left BKA stump on 07/21/2013 by Dr. Sharol Given. Left knee x-ray with soft tissue swelling and subcutaneous air most likely representing ongoing cellulitis. Lactic acid level  was elevated at 2.29.  Plan.  - Admit to floor - IVF 2 L of normal saline bolus given in ED  - Continue with IV fluids, normal saline 125 cc per hour for the next 10 hours  - Continue with vancomycin and cefepime for coverage of MRSA, gram negatives and Pseudomonas (though less likely).  - recheck lactic acid level in a.m  - Blood cultures X2  - Will contact Dr. Sharol Given in a.m about patient's admission. No indication for acute surgical intervention, also pt said samples for wound cultures were taken in the clinic- Follow up on results. - Consult physical therapy and occupational therapy  - Consult wound care  - Keep left leg elevated   # Type 2 diabetes: Last A1c on 07/22/2013 was 6.8%. Patient takes Lantus 10 units every evening and a sliding scale insulin. His blood sugars have been somehow elevated since his acute illness.  Plan.  - SSI-S -Monitor CBGs -Lantus at 10 units each bedtime.    # CAD: Can safely continue with beta blockers and Imdur. Patient had male complaining of chest pain, but which had resolved by the time of my evaluation. Given his increased risk factors for cardiac ischemia, including diabetes, and hypertension, with a history of stent placement, and EKG was obtained at Dr. Jess Barters office and was reportedly negative, was not present in pts physical chart. Troponin X 1 on arrival was negative done was negative  Plan.  -Repeat EKG.  -Troponin x1.   Dispo: Disposition is deferred at this time, awaiting improvement of current medical problems.  The patient does have a current PCP (No Pcp Per Patient) and does need an [facility] hospital follow-up appointment after discharge.  The patient does not know have transportation limitations that hinder transportation to clinic  appointments.  Signed: Jenetta Downer, MD 07/28/2013, 5:39 AM

## 2013-07-27 NOTE — ED Notes (Signed)
Pt had surgery on left leg on 07/21/13 for BKA. States that site has been red, hot to the touch, foul odor, and drainage from surgical site. States went to Dr. Rosann Auerbachouda's office this am and was told to come to the ER for admission for IV ABX administration.

## 2013-07-27 NOTE — Progress Notes (Addendum)
ANTIBIOTIC CONSULT NOTE - INITIAL  Pharmacy Consult for vancomycin Indication: L BKA wound infection  Allergies  Allergen Reactions  . Gardenia Jasminoides Fruit   . Kiwi Extract   . Mango Butter   . Other     Med thru epidural-unknown med Ragweed,milkweed  . Penicillins     Patient Measurements: Height: 5' 10.87" (180 cm) Weight: 190 lb 0.6 oz (86.2 kg) IBW/kg (Calculated) : 74.99  Vital Signs: Temp: 99.1 F (37.3 C) (04/02 1727) Temp src: Rectal (04/02 1727) BP: 66/48 mmHg (04/02 1709) Pulse Rate: 72 (04/02 1709) Intake/Output from previous day:   Intake/Output from this shift:    Labs:  Recent Labs  07/27/13 1721  WBC 11.6*  HGB 10.7*  PLT 254  CREATININE 1.34   Estimated Creatinine Clearance: 59.9 ml/min (by C-G formula based on Cr of 1.34). No results found for this basename: VANCOTROUGH, VANCOPEAK, VANCORANDOM, GENTTROUGH, GENTPEAK, GENTRANDOM, TOBRATROUGH, TOBRAPEAK, TOBRARND, AMIKACINPEAK, AMIKACINTROU, AMIKACIN,  in the last 72 hours   Microbiology: No results found for this or any previous visit (from the past 720 hour(s)).  Medical History: Past Medical History  Diagnosis Date  . Diabetes mellitus without complication   . Hypertension   . Neuropathy   . Hyperlipemia   . History of blood transfusion   . Complication of anesthesia     spinal and  epidural "cant take"  allergic, reports having knot and was told not to use again    Medications:  See PTA medication list  Assessment: 63 y/o male sent to the ED by his physician for hyperglycemia. Patient is pale, diaphoretic, and dizzy. He was recently discharged after having a L BKA revision on 3/27. Pharmacy consulted to begin vancomycin for a L BKA wound infection. SCr is 1.34 and higher than last admit. WBC are elevated, he is afebrile, and cultures are pending.  Goal of Therapy:  Vancomycin trough level 15-20 mcg/ml  Plan:  - Vancomycin 1500 mg IV now then 750 mg IV q12h - Monitor renal  function, clinical progress, and culture data  [facility] Of North Alabama IncJennifer Graysville, PlainfieldPharm.D., BCPS Clinical Pharmacist Pager: [phone] 07/27/2013 6:32 PM   Addendum: Also consulted to begin cefepime.  Cefepime 1 g IV q8h  [facility]ancock County HospitalJennifer Pepper Pike, 17[address] Rainbow BoulevardPharm.D., BCPS Clinical Pharmacist Pager: [phone] 07/27/2013 8:17 PM

## 2013-07-27 NOTE — ED Notes (Signed)
Pt sent here by doctor for hyperglycemia. Pt presents to the ER very pale, diaphoretic and having dizziness and chest pain. Pt very weak. Pt hypotensive at triage. Pt recent left BKA.

## 2013-07-28 ENCOUNTER — Encounter (HOSPITAL_COMMUNITY): Payer: Self-pay | Admitting: General Practice

## 2013-07-28 LAB — COMPREHENSIVE METABOLIC PANEL
ALK PHOS: 61 U/L (ref 39–117)
ALT: 12 U/L (ref 0–53)
AST: 10 U/L (ref 0–37)
Albumin: 2.7 g/dL — ABNORMAL LOW (ref 3.5–5.2)
BILIRUBIN TOTAL: 0.3 mg/dL (ref 0.3–1.2)
BUN: 18 mg/dL (ref 6–23)
CHLORIDE: 99 meq/L (ref 96–112)
CO2: 20 mEq/L (ref 19–32)
Calcium: 8 mg/dL — ABNORMAL LOW (ref 8.4–10.5)
Creatinine, Ser: 1.04 mg/dL (ref 0.50–1.35)
GFR calc Af Amer: 86 mL/min — ABNORMAL LOW (ref >90)
GFR calc non Af Amer: 74 mL/min — ABNORMAL LOW (ref >90)
Glucose, Bld: 182 mg/dL — ABNORMAL HIGH (ref 70–99)
POTASSIUM: 4 meq/L (ref 3.7–5.3)
SODIUM: 134 meq/L — AB (ref 137–147)
TOTAL PROTEIN: 6.2 g/dL (ref 6.0–8.3)

## 2013-07-28 LAB — GLUCOSE, CAPILLARY
GLUCOSE-CAPILLARY: 317 mg/dL — AB (ref 70–99)
GLUCOSE-CAPILLARY: 263 mg/dL — AB (ref 70–99)
Glucose-Capillary: 190 mg/dL — ABNORMAL HIGH (ref 70–99)
Glucose-Capillary: 246 mg/dL — ABNORMAL HIGH (ref 70–99)
Glucose-Capillary: 269 mg/dL — ABNORMAL HIGH (ref 70–99)

## 2013-07-28 LAB — CBC
HEMATOCRIT: 24.6 % — AB (ref 39.0–52.0)
Hemoglobin: 8.8 g/dL — ABNORMAL LOW (ref 13.0–17.0)
MCH: 30.1 pg (ref 26.0–34.0)
MCHC: 35.8 g/dL (ref 30.0–36.0)
MCV: 84.2 fL (ref 78.0–100.0)
Platelets: 193 10*3/uL (ref 150–400)
RBC: 2.92 MIL/uL — ABNORMAL LOW (ref 4.22–5.81)
RDW: 14 % (ref 11.5–15.5)
WBC: 6.8 10*3/uL (ref 4.0–10.5)

## 2013-07-28 LAB — TROPONIN I: Troponin I: <0.3 ng/mL (ref <0.30)

## 2013-07-28 LAB — LACTIC ACID, PLASMA: Lactic Acid, Venous: 0.8 mmol/L (ref 0.5–2.2)

## 2013-07-28 MED ORDER — OXYCODONE-ACETAMINOPHEN 5-325 MG PO TABS
1.0000 | ORAL_TABLET | ORAL | Status: DC | PRN
  Administered 2013-07-28 – 2013-08-03 (×19): 2 via ORAL
  Filled 2013-07-28 (×18): qty 2

## 2013-07-28 MED ORDER — METHOCARBAMOL 500 MG PO TABS
500.0000 mg | ORAL_TABLET | Freq: Three times a day (TID) | ORAL | Status: DC
  Administered 2013-07-28 – 2013-08-03 (×18): 500 mg via ORAL
  Filled 2013-07-28 (×28): qty 1

## 2013-07-28 MED ORDER — OXYCODONE-ACETAMINOPHEN 5-325 MG PO TABS
1.0000 | ORAL_TABLET | ORAL | Status: DC | PRN
  Administered 2013-07-28: 1 via ORAL
  Filled 2013-07-28: qty 1

## 2013-07-28 MED ORDER — SODIUM CHLORIDE 0.9 % IV SOLN
INTRAVENOUS | Status: AC
  Administered 2013-07-28 – 2013-07-29 (×2): via INTRAVENOUS

## 2013-07-28 MED ORDER — MORPHINE SULFATE 2 MG/ML IJ SOLN
1.0000 mg | INTRAMUSCULAR | Status: DC | PRN
  Administered 2013-07-30: 1 mg via INTRAVENOUS
  Administered 2013-08-01: 2 mg via INTRAVENOUS
  Administered 2013-08-01: 1 mg via INTRAVENOUS
  Filled 2013-07-28 (×3): qty 1

## 2013-07-28 MED ORDER — SIMVASTATIN 40 MG PO TABS
40.0000 mg | ORAL_TABLET | Freq: Every day | ORAL | Status: DC
  Administered 2013-07-28 – 2013-08-02 (×5): 40 mg via ORAL
  Filled 2013-07-28 (×9): qty 1

## 2013-07-28 MED ORDER — SODIUM CHLORIDE 0.9 % IV SOLN: INTRAVENOUS | Status: DC

## 2013-07-28 MED ORDER — VANCOMYCIN HCL IN DEXTROSE 750-5 MG/150ML-% IV SOLN
750.0000 mg | Freq: Two times a day (BID) | INTRAVENOUS | Status: DC
  Administered 2013-07-28 – 2013-08-02 (×9): 750 mg via INTRAVENOUS
  Filled 2013-07-28 (×11): qty 150

## 2013-07-28 MED ORDER — HEPARIN SODIUM (PORCINE) 5000 UNIT/ML IJ SOLN
5000.0000 [IU] | Freq: Three times a day (TID) | INTRAMUSCULAR | Status: DC
  Administered 2013-07-28 – 2013-07-31 (×9): 5000 [IU] via SUBCUTANEOUS
  Filled 2013-07-28 (×12): qty 1

## 2013-07-28 MED ORDER — ISOSORBIDE MONONITRATE ER 30 MG PO TB24
30.0000 mg | ORAL_TABLET | Freq: Every day | ORAL | Status: DC
  Filled 2013-07-28: qty 1

## 2013-07-28 MED ORDER — METOPROLOL SUCCINATE ER 25 MG PO TB24
25.0000 mg | ORAL_TABLET | Freq: Every day | ORAL | Status: DC
  Filled 2013-07-28: qty 1

## 2013-07-28 MED ORDER — INSULIN GLARGINE 100 UNIT/ML ~~LOC~~ SOLN
10.0000 [IU] | Freq: Every day | SUBCUTANEOUS | Status: DC
Start: 2013-07-28 — End: 2013-07-29
  Administered 2013-07-28: 10 [IU] via SUBCUTANEOUS
  Filled 2013-07-28: qty 0.1

## 2013-07-28 NOTE — Progress Notes (Signed)
Inpatient Diabetes Program Recommendations  AACE/ADA: New Consensus Statement on Inpatient Glycemic Control (2013)  Target Ranges:  Prepandial:   less than 140 mg/dL      Peak postprandial:   less than 180 mg/dL (1-2 hours)      Critically ill patients:  140 - 180 mg/dL    Inpatient Diabetes Program Recommendations Insulin - Basal: Recommend adding home lantus 10 units to regimen while here. Glucose readings are high in 200's which may be a hindrance to wound healing. Correction (SSI): and/or increase correction to moderate tidwc from sensitive  Thank you, Lenor CoffinAnn Carigan Lister, RN, CNS, Diabetes Coordinator [phone])

## 2013-07-28 NOTE — Progress Notes (Signed)
Utilization review completed. Laury Huizar, RN, BSN. 

## 2013-07-28 NOTE — Progress Notes (Signed)
PT Cancellation Note  Patient Details Name: Ryan CuretJemi Owens MRN: [id number] DOB: [birth year]   Cancelled Treatment:    Reason Eval/Treat Not Completed: Patient not medically ready.  Resident note states will contact Dr Lajoyce Cornersuda this am about pt's admit.  Will hold PT and mobility until pt assessed by Ortho.  Please advise when pt is appropriate for PT eval.  Thanks.     Sunny SchleinRitenour, Tegan Britain F, South CarolinaPT [phone] 07/28/2013, 8:34 AM

## 2013-07-28 NOTE — Progress Notes (Signed)
[facility]  Patient Status: Active (receiving services up to time of hospitalization)  [facility] is providing the following services: PT and OT  If patient discharges after hours, please call [phone].   Ryan Owens 07/28/2013, 2:42 PM

## 2013-07-28 NOTE — Progress Notes (Signed)
Subjective: Patient seen and examined at the bedside this morning. He feels about the same this morning. He denies chest pain or shortness of breath. His stump remains tender.  Objective: Vital signs in last 24 hours: Filed Vitals:   07/27/13 2315 07/28/13 0035 07/28/13 0625 07/28/13 0709  BP: 124/74 100/48 135/86   Pulse: 90 85 90   Temp:  98.7 F (37.1 C) 98.7 F (37.1 C)   TempSrc:  Oral Oral   Resp:  16 16   Height:      Weight:    233 lb 14.5 oz (106.1 kg)  SpO2: 95% 98% 100%    Weight change:   Intake/Output Summary (Last 24 hours) at 07/28/13 1220 Last data filed at 07/28/13 1000  Gross per 24 hour  Intake 1419.17 ml  Output   1525 ml  Net -105.83 ml   Physical Exam:  GENERAL- alert, co-operative, appears as stated age, not in any distress.  HEENT- Atraumatic, normocephalic, PERRL, EOMI, oral mucosa appears moist, good and intact dentition. No carotid bruit, no cervical LN enlargement, thyroid does not appear enlarged.  CARDIAC- RRR, no murmurs, rubs or gallops.  RESP- Moving equal volumes of air, and clear to auscultation bilaterally.  ABDOMEN- Soft, nontender, no palpable masses or organomegaly, bowel sounds present.  NEURO- No obvious Cr N abnormality, strenght 5/5 in Right lower extremity, and upper extremities.  EXTREMITIES- pulse 2+ Right lower extremity, no pedal edema. Left lower extremity- BKA, stitches present on stump site, some erythema and swelling, warm on palpation, small amount of purulent drainage noted from medial suture sites. Not malodorous. SKIN- Warm, dry, No rash or lesion.  PSYCH- Normal mood and affect, appropriate thought content and speech.  Lab Results: Basic Metabolic Panel:  Recent Labs Lab 07/27/13 1721 07/28/13 0413  NA 132* 134*  K 5.1 4.0  CL 93* 99  CO2 21 20  GLUCOSE 403* 182*  BUN 20 18  CREATININE 1.34 1.04  CALCIUM 9.1 8.0*   Liver Function Tests:  Recent Labs Lab 07/27/13 1721 07/28/13 0413  AST 16 10  ALT  15 12  ALKPHOS 71 61  BILITOT 0.6 0.3  PROT 7.1 6.2  ALBUMIN 3.0* 2.7*   CBC:  Recent Labs Lab 07/21/13 1342 07/27/13 1721 07/28/13 0413  WBC 7.5 11.6* 6.8  NEUTROABS  --  8.1*  --   HGB 13.1 10.7* 8.8*  HCT 37.0* 29.9* 24.6*  MCV 85.3 84.7 84.2  PLT 169 254 193   Cardiac Enzymes:  Recent Labs Lab 07/27/13 1721 07/28/13 0805  TROPONINI <0.30 <0.30   CBG:  Recent Labs Lab 07/24/13 1623 07/27/13 1716 07/27/13 1950 07/28/13 0012 07/28/13 0656 07/28/13 1121  GLUCAP 190* 362* 308* 269* 190* 246*   Hemoglobin A1C:  Recent Labs Lab 07/22/13 0435  HGBA1C 6.8*   Coagulation:  Recent Labs Lab 07/21/13 1342  LABPROT 13.6  INR 1.06   Urinalysis:  Recent Labs Lab 07/27/13 2019  COLORURINE YELLOW  LABSPEC 1.017  PHURINE 6.5  GLUCOSEU 100*  HGBUR NEGATIVE  BILIRUBINUR NEGATIVE  KETONESUR NEGATIVE  PROTEINUR 100*  UROBILINOGEN 0.2  NITRITE NEGATIVE  LEUKOCYTESUR NEGATIVE    Micro Results: Recent Results (from the past 240 hour(s))  WOUND CULTURE     Status: None   Collection Time    07/27/13  5:26 PM      Result Value Ref Range Status   Specimen Description WOUND LEFT LEG   Final   Special Requests NONE   Final  Gram Stain     Final   Value: RARE WBC PRESENT, PREDOMINANTLY PMN     NO SQUAMOUS EPITHELIAL CELLS SEEN     RARE GRAM POSITIVE COCCI     IN PAIRS     Performed at [facility]   Culture     Final   Value: Culture reincubated for better growth     Performed at [facility]   Report Status PENDING   Incomplete   Studies/Results: Dg Chest Port 1 View  07/27/2013   CLINICAL DATA:  Chest pain  EXAM: PORTABLE CHEST - 1 VIEW  COMPARISON:  Prior chest x-ray 07/21/2013  FINDINGS: The lungs are clear and negative for focal airspace consolidation, pulmonary edema or suspicious pulmonary nodule. No pleural effusion or pneumothorax. Cardiac and mediastinal contours are within normal limits. No acute fracture or lytic or blastic  osseous lesions. The visualized upper abdominal bowel gas pattern is unremarkable.  IMPRESSION: No active cardiopulmonary disease.   Electronically Signed   By: Malachy Moan M.D.   On: 07/27/2013 18:38   Dg Knee Left Port  07/27/2013   CLINICAL DATA:  Possible amputation site infection  EXAM: PORTABLE LEFT KNEE - 1-2 VIEW  COMPARISON:  None.  FINDINGS: Surgical changes of below-the-knee amputation. Cutaneous skin staples are present. Small locular of air are noted inferior to the tibial osteotomy site. There is diffuse soft tissue swelling of the stump soft tissues. No irregularity of the osseous margins to suggest osseous involvement. Mild degenerative changes in the knee joint. No knee joint effusion. Atherosclerotic vascular calcifications are noted.  IMPRESSION: 1. Soft tissue swelling and a small amount of the subcutaneous air in the amputation stump overlying the tibial osteotomy site. Depending on how recent the amputation was, these may represent normal postoperative findings or an infectious process. 2. No evidence of osseous involvement. The osteotomy sites remain sharp and well-defined.   Electronically Signed   By: Malachy Moan M.D.   On: 07/27/2013 18:40   Medications: I have reviewed the patient's current medications. Scheduled Meds: . ceFEPime (MAXIPIME) IV  1 g Intravenous Q8H  . insulin aspart  0-9 Units Subcutaneous TID WC  . methocarbamol  500 mg Oral 3 times per day  . simvastatin  40 mg Oral QHS  . vancomycin  750 mg Intravenous Q12H   Continuous Infusions:  PRN Meds:.morphine injection, oxyCODONE-acetaminophen Assessment/Plan: 62 year old gentleman, with past medical history of diabetes, CAD, hypertension, and left BKA in 11/2012, presents with fevers, chills, which started the morning of admission during his orthopedic's office visit. He had revision of his left BKA outpatient site on 07/21/2013.   #Sepsis 2/2 cellulitis/SSI of left BKA stump - Exam significant for  left stump that is erythematous, tender, small amount of purulent drainage from medial sutures. It is not malodorous. Lactate down trended to normal today status post IV fluid resuscitation. No fevers overnight. Wound culture shows rare gram-positive cocci in pairs, rare white blood cells. Blood and urine cultures are pending. Wound care saw the patient and felt the request is beyond the scope of practice. - Consult to [name], he will see the patient later today - Continue IV vancomycin and cefepime for coverage of MRSA, gram negatives and Pseudomonas - Keep left leg elevated  - Pain control with Percocet 1-2 tabs every 4 hours as needed, morphine 1-2 mg IV every 4 hours as needed, home Robaxin 500mg  3 times a day - BMP, CBC in am - Consult PT/OT  #Type  2 diabetes - Last A1c on 07/22/2013 was 6.8%. Patient takes Lantus 10 units every evening and a sliding scale insulin. His blood sugars have been somehow elevated since his acute illness.  Plan.  - SSI sensitive  - Monitor CBGs  - Lantus at 10 units each bedtime. - Carb modified diet   Glucose-Capillary  Date Value Ref Range Status  07/28/2013 246* 70 - 99 mg/dL Final  4/0/9811 914* 70 - 99 mg/dL Final  11/02/2954 213* 70 - 99 mg/dL Final  0/11/6576 469* 70 - 99 mg/dL Final  09/27/9526 413* 70 - 99 mg/dL Final    #Acute normocytic anemia - This is likely dilutional from IV fluid resuscitation. - Continue to monitor  Hemoglobin  Date Value Ref Range Status  07/28/2013 8.8* 13.0 - 17.0 g/dL Final     REPEATED TO VERIFY     DELTA CHECK NOTED  07/27/2013 10.7* 13.0 - 17.0 g/dL Final  2/44/0102 72.5  13.0 - 17.0 g/dL Final    #CAD - Troponins were negative x2. EKG unremarkable.  - Holding home Imdur 30 mg daily, metoprolol 25 mg twice a day given soft pressures on admission and no history of heart failure, may restart these tomorrow - Continue simvastatin 40 mg daily  #DVT PPX - Heparin subq  Dispo: Disposition is deferred at this time,  awaiting improvement of current medical problems.  Anticipated discharge in approximately 1-3 day(s).   The patient does not have a current PCP (No Pcp Per Patient) and does not need an [facility] hospital follow-up appointment after discharge.  The patient does not have transportation limitations that hinder transportation to clinic appointments.  .Services Needed at time of discharge: Y = Yes, Blank = No PT:   OT:   RN:   Equipment:   Other:     LOS: 1 day   Vivi Barrack, MD 07/28/2013, 12:20 PM  Vivi Barrack, MD  [email or web address] Pager # [phone] Office # [phone]

## 2013-07-28 NOTE — Consult Note (Signed)
Reason for Consult: Left transtibial amputation with cellulitis Referring Physician: Authur Cubit is an 63 y.o. male.  HPI: Patient is a 63 year old gentleman who is about 2 weeks status post a left transtibial amputation. Patient presented to the office with elevated blood sugars in the 300s. Patient states that his sugars have been running up to the 500s. Patient states that he had narrowing vision was shaking having loss of consciousness and was diaphoretic. He was emergently sent to the emergency room to rule out cardiac or other significant event other than his elevated blood glucose. Examination of his residual limbs showed a small amount of redness dorsally over the distal tibia.  Past Medical History  Diagnosis Date  . Diabetes mellitus without complication   . Hypertension   . Neuropathy   . Hyperlipemia   . History of blood transfusion   . Complication of anesthesia     spinal and  epidural "cant take"  allergic, reports having knot and was told not to use again  . Coronary artery disease     Past Surgical History  Procedure Laterality Date  . 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 Appendectomy  1984  . Coronary angioplasty with stent placement    . Angioplasty  1994  . Wisdom tooth extraction    . Amputation Left 2014    toe, foot,   . Amputation Left 07/21/2013    Procedure: AMPUTATION BELOW KNEE;  Surgeon: Newt Minion, MD;  Location: [place];  Service: Orthopedics;  Laterality: Left;  Revision Left Below Knee Amputation    History reviewed. No pertinent family history.  Social History:  reports that he has quit smoking. He has never used smokeless tobacco. He reports that he does not drink alcohol or use illicit drugs.  Allergies:  Allergies  Allergen Reactions  . Gardenia Jasminoides Fruit   . Kiwi Extract   . Mango Butter   . Other     Med thru epidural-unknown med Ragweed,milkweed  . Penicillins Rash    Medications: I have reviewed the patient's  current medications.  Results for orders placed during the hospital encounter of 07/27/13 (from the past 48 hour(s))  CBG MONITORING, ED     Status: Abnormal   Collection Time    07/27/13  5:16 PM      Result Value Ref Range   Glucose-Capillary 362 (*) 70 - 99 mg/dL  CBC WITH DIFFERENTIAL     Status: Abnormal   Collection Time    07/27/13  5:21 PM      Result Value Ref Range   WBC 11.6 (*) 4.0 - 10.5 K/uL   RBC 3.53 (*) 4.22 - 5.81 MIL/uL   Hemoglobin 10.7 (*) 13.0 - 17.0 g/dL   HCT 29.9 (*) 39.0 - 52.0 %   MCV 84.7  78.0 - 100.0 fL   MCH 30.3  26.0 - 34.0 pg   MCHC 35.8  30.0 - 36.0 g/dL   RDW 13.9  11.5 - 15.5 %   Platelets 254  150 - 400 K/uL   Neutrophils Relative % 69  43 - 77 %   Neutro Abs 8.1 (*) 1.7 - 7.7 K/uL   Lymphocytes Relative 19  12 - 46 %   Lymphs Abs 2.2  0.7 - 4.0 K/uL   Monocytes Relative 9  3 - 12 %   Monocytes Absolute 1.0  0.1 - 1.0 K/uL   Eosinophils Relative 2  0 - 5 %   Eosinophils Absolute 0.2  0.0 - 0.7 K/uL   Basophils Relative 1  0 - 1 %   Basophils Absolute 0.1  0.0 - 0.1 K/uL  COMPREHENSIVE METABOLIC PANEL     Status: Abnormal   Collection Time    07/27/13  5:21 PM      Result Value Ref Range   Sodium 132 (*) 137 - 147 mEq/L   Potassium 5.1  3.7 - 5.3 mEq/L   Chloride 93 (*) 96 - 112 mEq/L   CO2 21  19 - 32 mEq/L   Glucose, Bld 403 (*) 70 - 99 mg/dL   BUN 20  6 - 23 mg/dL   Creatinine, Ser 1.34  0.50 - 1.35 mg/dL   Calcium 9.1  8.4 - 10.5 mg/dL   Total Protein 7.1  6.0 - 8.3 g/dL   Albumin 3.0 (*) 3.5 - 5.2 g/dL   AST 16  0 - 37 U/L   ALT 15  0 - 53 U/L   Alkaline Phosphatase 71  39 - 117 U/L   Total Bilirubin 0.6  0.3 - 1.2 mg/dL   GFR calc non Af Amer 55 (*) >90 mL/min   GFR calc Af Amer 64 (*) >90 mL/min   Comment: (NOTE)     The eGFR has been calculated using the CKD EPI equation.     This calculation has not been validated in all clinical situations.     eGFR's persistently <90 mL/min signify possible Chronic Kidney      Disease.  TROPONIN I     Status: None   Collection Time    07/27/13  5:21 PM      Result Value Ref Range   Troponin I <0.30  <0.30 ng/mL   Comment:            Due to the release kinetics of cTnI,     a negative result within the first hours     of the onset of symptoms does not rule out     myocardial infarction with certainty.     If myocardial infarction is still suspected,     repeat the test at appropriate intervals.  WOUND CULTURE     Status: None   Collection Time    07/27/13  5:26 PM      Result Value Ref Range   Specimen Description WOUND LEFT LEG     Special Requests NONE     Gram Stain       Value: RARE WBC PRESENT, PREDOMINANTLY PMN     NO SQUAMOUS EPITHELIAL CELLS SEEN     RARE GRAM POSITIVE COCCI     IN PAIRS     Performed at [facility]   Culture       Value: Culture reincubated for better growth     Performed at [facility]   Report Status PENDING    I-STAT CG4 LACTIC ACID, ED     Status: Abnormal   Collection Time    07/27/13  5:38 PM      Result Value Ref Range   Lactic Acid, Venous 2.29 (*) 0.5 - 2.2 mmol/L  CBG MONITORING, ED     Status: Abnormal   Collection Time    07/27/13  7:50 PM      Result Value Ref Range   Glucose-Capillary 308 (*) 70 - 99 mg/dL  URINALYSIS, ROUTINE W REFLEX MICROSCOPIC     Status: Abnormal   Collection Time    07/27/13  8:19 PM  Result Value Ref Range   Color, Urine YELLOW  YELLOW   APPearance CLOUDY (*) CLEAR   Specific Gravity, Urine 1.017  1.005 - 1.030   pH 6.5  5.0 - 8.0   Glucose, UA 100 (*) NEGATIVE mg/dL   Hgb urine dipstick NEGATIVE  NEGATIVE   Bilirubin Urine NEGATIVE  NEGATIVE   Ketones, ur NEGATIVE  NEGATIVE mg/dL   Protein, ur 100 (*) NEGATIVE mg/dL   Urobilinogen, UA 0.2  0.0 - 1.0 mg/dL   Nitrite NEGATIVE  NEGATIVE   Leukocytes, UA NEGATIVE  NEGATIVE  URINE MICROSCOPIC-ADD ON     Status: Abnormal   Collection Time    07/27/13  8:19 PM      Result Value Ref Range   Squamous  Epithelial / LPF RARE  RARE   Casts HYALINE CASTS (*) NEGATIVE   Comment: GRANULAR CAST   Urine-Other AMORPHOUS URATES/PHOSPHATES    GLUCOSE, CAPILLARY     Status: Abnormal   Collection Time    07/28/13 12:12 AM      Result Value Ref Range   Glucose-Capillary 269 (*) 70 - 99 mg/dL  LACTIC ACID, PLASMA     Status: None   Collection Time    07/28/13  4:13 AM      Result Value Ref Range   Lactic Acid, Venous 0.8  0.5 - 2.2 mmol/L  COMPREHENSIVE METABOLIC PANEL     Status: Abnormal   Collection Time    07/28/13  4:13 AM      Result Value Ref Range   Sodium 134 (*) 137 - 147 mEq/L   Potassium 4.0  3.7 - 5.3 mEq/L   Comment: DELTA CHECK NOTED   Chloride 99  96 - 112 mEq/L   CO2 20  19 - 32 mEq/L   Glucose, Bld 182 (*) 70 - 99 mg/dL   BUN 18  6 - 23 mg/dL   Creatinine, Ser 1.04  0.50 - 1.35 mg/dL   Calcium 8.0 (*) 8.4 - 10.5 mg/dL   Total Protein 6.2  6.0 - 8.3 g/dL   Albumin 2.7 (*) 3.5 - 5.2 g/dL   AST 10  0 - 37 U/L   ALT 12  0 - 53 U/L   Alkaline Phosphatase 61  39 - 117 U/L   Total Bilirubin 0.3  0.3 - 1.2 mg/dL   GFR calc non Af Amer 74 (*) >90 mL/min   GFR calc Af Amer 86 (*) >90 mL/min   Comment: (NOTE)     The eGFR has been calculated using the CKD EPI equation.     This calculation has not been validated in all clinical situations.     eGFR's persistently <90 mL/min signify possible Chronic Kidney     Disease.  CBC     Status: Abnormal   Collection Time    07/28/13  4:13 AM      Result Value Ref Range   WBC 6.8  4.0 - 10.5 K/uL   RBC 2.92 (*) 4.22 - 5.81 MIL/uL   Hemoglobin 8.8 (*) 13.0 - 17.0 g/dL   Comment: REPEATED TO VERIFY     DELTA CHECK NOTED   HCT 24.6 (*) 39.0 - 52.0 %   MCV 84.2  78.0 - 100.0 fL   MCH 30.1  26.0 - 34.0 pg   MCHC 35.8  30.0 - 36.0 g/dL   RDW 14.0  11.5 - 15.5 %   Platelets 193  150 - 400 K/uL   Comment: REPEATED TO VERIFY  DELTA CHECK NOTED  GLUCOSE, CAPILLARY     Status: Abnormal   Collection Time    07/28/13  6:56 AM       Result Value Ref Range   Glucose-Capillary 190 (*) 70 - 99 mg/dL  TROPONIN I     Status: None   Collection Time    07/28/13  8:05 AM      Result Value Ref Range   Troponin I <0.30  <0.30 ng/mL   Comment:            Due to the release kinetics of cTnI,     a negative result within the first hours     of the onset of symptoms does not rule out     myocardial infarction with certainty.     If myocardial infarction is still suspected,     repeat the test at appropriate intervals.  GLUCOSE, CAPILLARY     Status: Abnormal   Collection Time    07/28/13 11:21 AM      Result Value Ref Range   Glucose-Capillary 246 (*) 70 - 99 mg/dL   Comment 1 Notify RN     Comment 2 Documented in Chart    GLUCOSE, CAPILLARY     Status: Abnormal   Collection Time    07/28/13  4:43 PM      Result Value Ref Range   Glucose-Capillary 317 (*) 70 - 99 mg/dL   Comment 1 Notify RN     Comment 2 Documented in Chart      Dg Chest Port 1 View  07/27/2013   CLINICAL DATA:  Chest pain  EXAM: PORTABLE CHEST - 1 VIEW  COMPARISON:  Prior chest x-ray 07/21/2013  FINDINGS: The lungs are clear and negative for focal airspace consolidation, pulmonary edema or suspicious pulmonary nodule. No pleural effusion or pneumothorax. Cardiac and mediastinal contours are within normal limits. No acute fracture or lytic or blastic osseous lesions. The visualized upper abdominal bowel gas pattern is unremarkable.  IMPRESSION: No active cardiopulmonary disease.   Electronically Signed   By: Jacqulynn Cadet M.D.   On: 07/27/2013 18:38   Dg Knee Left Port  07/27/2013   CLINICAL DATA:  Possible amputation site infection  EXAM: PORTABLE LEFT KNEE - 1-2 VIEW  COMPARISON:  None.  FINDINGS: Surgical changes of below-the-knee amputation. Cutaneous skin staples are present. Small locular of air are noted inferior to the tibial osteotomy site. There is diffuse soft tissue swelling of the stump soft tissues. No irregularity of the osseous margins to  suggest osseous involvement. Mild degenerative changes in the knee joint. No knee joint effusion. Atherosclerotic vascular calcifications are noted.  IMPRESSION: 1. Soft tissue swelling and a small amount of the subcutaneous air in the amputation stump overlying the tibial osteotomy site. Depending on how recent the amputation was, these may represent normal postoperative findings or an infectious process. 2. No evidence of osseous involvement. The osteotomy sites remain sharp and well-defined.   Electronically Signed   By: Jacqulynn Cadet M.D.   On: 07/27/2013 18:40    Review of Systems  All other systems reviewed and are negative.   Blood pressure 135/86, pulse 90, temperature 98.7 F (37.1 C), temperature source Oral, resp. rate 16, height 5' 10.87" (1.8 m), weight 106.1 kg (233 lb 14.5 oz), SpO2 100.00%. Physical Exam On examination there is no dehiscence of the wound. There is clear serosanguineous drainage. There is redness about 5 cm in diameter dorsally over the distal tibia. Assessment/Plan: Assessment:  Uncontrolled diabetes with possible cellulitis left transtibial amputation.  Plan: I agree with the IV antibiotics. We will hold on any surgical intervention at this time. Definitely critical for patient to have better glucose control. I will followup on Monday.  Juletta Berhe V 07/28/2013, 5:20 PM

## 2013-07-28 NOTE — Progress Notes (Signed)
OT Cancellation and Discharge Note  Patient Details Name: Ryan CuretJemi Owens MRN: [id number] DOB: 1950/11/05   Cancelled Treatment:    Reason Eval/Treat Not Completed: OT screened, no needs identified, will sign off. Pt with previous L BKA, now present for infection after revision on 07/21/13. Pt was functioning independently prior to infection and has no ADL concerns. Pt familiar with compensatory techniques and declines OT at this time. Acute OT to sign off at this time.   Rae LipsMiller, Jams Trickett M [phone] 07/28/2013, 3:20 PM

## 2013-07-28 NOTE — H&P (Signed)
INTERNAL MEDICINE TEACHING ATTENDING NOTE  Day 1 of stay  Patient name: Ryan CuretJemi Weissinger  MRN: [id number] Date of birth: 14-Apr-1951   63 y.o. man who presented with severe sepsis (with hypotension which responded to fluids) secondary to stump cellulitis - stump revised on 07/21/13. His past medical history is significant for hypertension, diabetes and CAD with stent placement. Xray shows subcutaneous air in stump with soft tissue swelling.   Upon evaluation, the patient today looked tired and lethargic. He was alert and oriented and could hold a meaningful conversation. He does not complain of chest pain. His left knee was under bandages and looked dry. I did not remove the bandages. I have seen the photograph of the wound as uploaded by my resident.  I have reviewed Dr. Fredirick LatheEmokpae's note and I agree with her plan. In addition:    Sepsis secondary to cellulitis - The patient appears to be improving clinically. He has not spiked a fever since the antibiotics have been started - Vancomycin and cefepime. I agree with this coverage. Dr. Lajoyce Cornersuda will be intimated this morning and we would appreciate his input. If the patient continues to get clinically better, he can be switched to PO antibiotics in 48 -72 hours and discharged. The choice of PO antibiotics would depend on the result of blood and wound cultures - Clinda + cipro, or doxy + cipro may be considered for broad coverage. I agree with wound care, PT and OT consults.   Type 2 diabetes - Agree with sliding scale coverage. Titrate according to blood sugar levels.          Recent Labs Lab 07/24/13 1623 07/27/13 1716 07/27/13 1950 07/28/13 0012 07/28/13 0656  GLUCAP 190* 362* 308* 269* 190*     I have seen and evaluated this patient and discussed it with my IM resident team.  Please see the rest of the plan per resident note from today.   Arena Lindahl 07/28/2013, 10:53 AM.

## 2013-07-28 NOTE — Consult Note (Addendum)
WOC wound consult note Reason for Consult: Consult requested for infected post-op amputation site.  Pt had recent surgery on 3/27 by Dr Lajoyce Cornersuda.  Consult by his service has been requested and recommendations are pending.  [facility]  Please defer to Dr Audrie Liauda's recommendations for further plan of care. Please re-consult if further assistance is needed.  Thank-you,  Cammie Mcgeeawn Shaquelle Hernon MSN, RN, CWOCN, [facility]AP, CNS [phone]

## 2013-07-29 LAB — BASIC METABOLIC PANEL
BUN: 15 mg/dL (ref 6–23)
CHLORIDE: 99 meq/L (ref 96–112)
CO2: 22 meq/L (ref 19–32)
Calcium: 8.5 mg/dL (ref 8.4–10.5)
Creatinine, Ser: 0.92 mg/dL (ref 0.50–1.35)
GFR calc non Af Amer: 88 mL/min — ABNORMAL LOW (ref >90)
Glucose, Bld: 231 mg/dL — ABNORMAL HIGH (ref 70–99)
POTASSIUM: 4.5 meq/L (ref 3.7–5.3)
SODIUM: 135 meq/L — AB (ref 137–147)

## 2013-07-29 LAB — GLUCOSE, CAPILLARY
GLUCOSE-CAPILLARY: 276 mg/dL — AB (ref 70–99)
GLUCOSE-CAPILLARY: 215 mg/dL — AB (ref 70–99)
Glucose-Capillary: 207 mg/dL — ABNORMAL HIGH (ref 70–99)
Glucose-Capillary: 206 mg/dL — ABNORMAL HIGH (ref 70–99)

## 2013-07-29 LAB — CBC
HCT: 25.6 % — ABNORMAL LOW (ref 39.0–52.0)
Hemoglobin: 8.9 g/dL — ABNORMAL LOW (ref 13.0–17.0)
MCH: 29.7 pg (ref 26.0–34.0)
MCHC: 34.8 g/dL (ref 30.0–36.0)
MCV: 85.3 fL (ref 78.0–100.0)
PLATELETS: 190 10*3/uL (ref 150–400)
RBC: 3 MIL/uL — AB (ref 4.22–5.81)
RDW: 13.8 % (ref 11.5–15.5)
WBC: 6.2 10*3/uL (ref 4.0–10.5)

## 2013-07-29 LAB — URINE CULTURE
CULTURE: NO GROWTH
Colony Count: NO GROWTH

## 2013-07-29 LAB — VANCOMYCIN, TROUGH: Vancomycin Tr: 9 ug/mL — ABNORMAL LOW (ref 10.0–20.0)

## 2013-07-29 MED ORDER — ISOSORBIDE MONONITRATE ER 30 MG PO TB24
30.0000 mg | ORAL_TABLET | Freq: Every day | ORAL | Status: DC
  Administered 2013-07-30 – 2013-08-03 (×5): 30 mg via ORAL
  Filled 2013-07-29 (×5): qty 1

## 2013-07-29 MED ORDER — INSULIN GLARGINE 100 UNIT/ML ~~LOC~~ SOLN
15.0000 [IU] | Freq: Every day | SUBCUTANEOUS | Status: DC
  Administered 2013-07-29 – 2013-08-01 (×4): 15 [IU] via SUBCUTANEOUS
  Filled 2013-07-29 (×4): qty 0.15

## 2013-07-29 MED ORDER — METOPROLOL SUCCINATE ER 25 MG PO TB24
25.0000 mg | ORAL_TABLET | Freq: Every day | ORAL | Status: DC
  Administered 2013-07-29 – 2013-08-03 (×6): 25 mg via ORAL
  Filled 2013-07-29 (×6): qty 1

## 2013-07-29 MED ORDER — INSULIN ASPART 100 UNIT/ML ~~LOC~~ SOLN
0.0000 [IU] | Freq: Three times a day (TID) | SUBCUTANEOUS | Status: DC
  Administered 2013-07-29 – 2013-07-31 (×6): 5 [IU] via SUBCUTANEOUS
  Administered 2013-08-01: 3 [IU] via SUBCUTANEOUS
  Administered 2013-08-01 (×2): 8 [IU] via SUBCUTANEOUS
  Administered 2013-08-02: 3 [IU] via SUBCUTANEOUS
  Administered 2013-08-02: 15 [IU] via SUBCUTANEOUS
  Administered 2013-08-02: 3 [IU] via SUBCUTANEOUS
  Administered 2013-08-03: 5 [IU] via SUBCUTANEOUS
  Administered 2013-08-03: 15 [IU] via SUBCUTANEOUS

## 2013-07-29 MED ORDER — INSULIN ASPART 100 UNIT/ML ~~LOC~~ SOLN
0.0000 [IU] | Freq: Every day | SUBCUTANEOUS | Status: DC
  Administered 2013-07-29: 2 [IU] via SUBCUTANEOUS
  Administered 2013-07-30: 3 [IU] via SUBCUTANEOUS
  Administered 2013-07-31: 2 [IU] via SUBCUTANEOUS
  Administered 2013-08-02: 4 [IU] via SUBCUTANEOUS

## 2013-07-29 NOTE — Progress Notes (Signed)
Pt lying in bed with family at bedside. Drsg to RLE D&I. Pt denies needs or pain at present. No acute distress noted at this time. Will continue to monitor pt.

## 2013-07-29 NOTE — Progress Notes (Signed)
   Subjective:  Patient reports pain as moderate when touched.    Objective:   VITALS:   Filed Vitals:   07/28/13 0709 07/28/13 2032 07/29/13 0500 07/29/13 0544  BP:  164/81  146/78  Pulse:  83  85  Temp:  97.8 F (36.6 C)  98.1 F (36.7 C)  TempSrc:  Oral  Oral  Resp:  16  16  Height:      Weight: 106.1 kg (233 lb 14.5 oz)  105.4 kg (232 lb 5.8 oz)   SpO2:  100%  95%    area of cellulitis and TTP on anterior aspect of BKA stump.  moderate drainage   Lab Results  Component Value Date   WBC 6.2 07/29/2013   HGB 8.9* 07/29/2013   HCT 25.6* 07/29/2013   MCV 85.3 07/29/2013   PLT 190 07/29/2013     Assessment/Plan:   - elevate extremity - DVT ppx - SCDs, ambulation, sub q heparin - NWB left and lower extremity - continue IV abx - glucose control  Problem List Items Addressed This Visit   None    Visit Diagnoses   Wound infection after surgery    -  Primary    Relevant Medications       vancomycin (VANCOCIN) IVPB 750 mg/150 ml premix    Uncontrolled diabetes mellitus        Relevant Medications       insulin aspart (novoLOG) injection 10 Units (Completed)       simvastatin (ZOCOR) tablet 40 mg       insulin glargine (LANTUS) injection 15 Units (Start on 07/29/2013 10:00 PM)       insulin aspart (novoLOG) injection 0-5 Units (Start on 07/29/2013 10:00 PM)       insulin aspart (novoLOG) injection 0-15 Units (Start on 07/29/2013 12:00 PM)        Cheral AlmasXu, Salvadore Valvano Michael 07/29/2013, 10:21 AM [phone]

## 2013-07-29 NOTE — Progress Notes (Addendum)
Subjective: Patient seen and examined at the bedside this morning. He feels about the same this morning. He denies chest pain or shortness of breath. His stump remains tender.  Objective: Vital signs in last 24 hours: Filed Vitals:   07/28/13 1400 07/28/13 2032 07/29/13 0500 07/29/13 0544  BP: 169/87 164/81  146/78  Pulse: 89 83  85  Temp: 98 F (36.7 C) 97.8 F (36.6 C)  98.1 F (36.7 C)  TempSrc:  Oral  Oral  Resp: 16 16  16   Height:      Weight:   232 lb 5.8 oz (105.4 kg)   SpO2: 97% 100%  95%   Weight change: 43 lb 14 oz (19.9 kg)  Intake/Output Summary (Last 24 hours) at 07/29/13 1426 Last data filed at 07/29/13 1108  Gross per 24 hour  Intake   1290 ml  Output   3075 ml  Net  -1785 ml   Physical Exam:  GENERAL- alert, co-operative, appears as stated age, not in any distress.  HEENT- Atraumatic, normocephalic, PERRL, EOMI, oral mucosa appears moist, good and intact dentition. No carotid bruit, no cervical LN enlargement, thyroid does not appear enlarged.  CARDIAC- RRR, no murmurs, rubs or gallops.  RESP- Moving equal volumes of air, and clear to auscultation bilaterally.  ABDOMEN- Soft, nontender, no palpable masses or organomegaly, bowel sounds present.  NEURO- No obvious Cr N abnormality, strenght 5/5 in Right lower extremity, and upper extremities.  EXTREMITIES- pulse 2+ Right lower extremity, no pedal edema. Left lower extremity- BKA, stitches present on stump site, some erythema and swelling, warm on palpation, small amount of purulent drainage noted from medial suture sites. Not malodorous. SKIN- Warm, dry, No rash or lesion.  PSYCH- Normal mood and affect, appropriate thought content and speech.  Lab Results: Basic Metabolic Panel:  Recent Labs Lab 07/28/13 0413 07/29/13 0510  NA 134* 135*  K 4.0 4.5  CL 99 99  CO2 20 22  GLUCOSE 182* 231*  BUN 18 15  CREATININE 1.04 0.92  CALCIUM 8.0* 8.5   Liver Function Tests:  Recent Labs Lab  07/27/13 1721 07/28/13 0413  AST 16 10  ALT 15 12  ALKPHOS 71 61  BILITOT 0.6 0.3  PROT 7.1 6.2  ALBUMIN 3.0* 2.7*   CBC:  Recent Labs Lab 07/27/13 1721 07/28/13 0413 07/29/13 0510  WBC 11.6* 6.8 6.2  NEUTROABS 8.1*  --   --   HGB 10.7* 8.8* 8.9*  HCT 29.9* 24.6* 25.6*  MCV 84.7 84.2 85.3  PLT 254 193 190   Cardiac Enzymes:  Recent Labs Lab 07/27/13 1721 07/28/13 0805  TROPONINI <0.30 <0.30   CBG:  Recent Labs Lab 07/28/13 0656 07/28/13 1121 07/28/13 1643 07/28/13 2146 07/29/13 0646 07/29/13 1059  GLUCAP 190* 246* 317* 263* 276* 207*   Hemoglobin A1C: No results found for this basename: HGBA1C,  in the last 168 hours Coagulation: No results found for this basename: LABPROT, INR,  in the last 168 hours Urinalysis:  Recent Labs Lab 07/27/13 2019  COLORURINE YELLOW  LABSPEC 1.017  PHURINE 6.5  GLUCOSEU 100*  HGBUR NEGATIVE  BILIRUBINUR NEGATIVE  KETONESUR NEGATIVE  PROTEINUR 100*  UROBILINOGEN 0.2  NITRITE NEGATIVE  LEUKOCYTESUR NEGATIVE    Micro Results: Recent Results (from the past 240 hour(s))  WOUND CULTURE     Status: None   Collection Time    07/27/13  5:26 PM      Result Value Ref Range Status   Specimen Description WOUND LEFT LEG  Final   Special Requests NONE   Final   Gram Stain     Final   Value: RARE WBC PRESENT, PREDOMINANTLY PMN     NO SQUAMOUS EPITHELIAL CELLS SEEN     RARE GRAM POSITIVE COCCI     IN PAIRS     Performed at [facility]   Culture     Final   Value: Culture reincubated for better growth     Performed at [facility]   Report Status PENDING   Incomplete  CULTURE, BLOOD (ROUTINE X 2)     Status: None   Collection Time    07/27/13  6:25 PM      Result Value Ref Range Status   Specimen Description BLOOD ARM RIGHT   Final   Special Requests BOTTLES DRAWN AEROBIC AND ANAEROBIC 10CC   Final   Culture  Setup Time     Final   Value: 07/28/2013 00:24     Performed at [facility]    Culture     Final   Value:        BLOOD CULTURE RECEIVED NO GROWTH TO DATE CULTURE WILL BE HELD FOR 5 DAYS BEFORE ISSUING A FINAL NEGATIVE REPORT     Performed at [facility]   Report Status PENDING   Incomplete  CULTURE, BLOOD (ROUTINE X 2)     Status: None   Collection Time    07/27/13  6:30 PM      Result Value Ref Range Status   Specimen Description BLOOD HAND RIGHT   Final   Special Requests BOTTLES DRAWN AEROBIC ONLY 1CC   Final   Culture  Setup Time     Final   Value: 07/28/2013 00:24     Performed at [facility]   Culture     Final   Value:        BLOOD CULTURE RECEIVED NO GROWTH TO DATE CULTURE WILL BE HELD FOR 5 DAYS BEFORE ISSUING A FINAL NEGATIVE REPORT     Performed at [facility]   Report Status PENDING   Incomplete  URINE CULTURE     Status: None   Collection Time    07/27/13  8:21 PM      Result Value Ref Range Status   Specimen Description URINE, CLEAN CATCH   Final   Special Requests NONE   Final   Culture  Setup Time     Final   Value: 07/28/2013 03:26     Performed at [facility] Count     Final   Value: NO GROWTH     Performed at [facility]   Culture     Final   Value: NO GROWTH     Performed at [facility]   Report Status 07/29/2013 FINAL   Final   Studies/Results: Dg Chest Port 1 View  07/27/2013   CLINICAL DATA:  Chest pain  EXAM: PORTABLE CHEST - 1 VIEW  COMPARISON:  Prior chest x-ray 07/21/2013  FINDINGS: The lungs are clear and negative for focal airspace consolidation, pulmonary edema or suspicious pulmonary nodule. No pleural effusion or pneumothorax. Cardiac and mediastinal contours are within normal limits. No acute fracture or lytic or blastic osseous lesions. The visualized upper abdominal bowel gas pattern is unremarkable.  IMPRESSION: No active cardiopulmonary disease.   Electronically Signed   By: Malachy Moan M.D.   On: 07/27/2013 18:38   Dg Knee Left Port  07/27/2013  CLINICAL DATA:  Possible amputation site infection  EXAM: PORTABLE LEFT KNEE - 1-2 VIEW  COMPARISON:  None.  FINDINGS: Surgical changes of below-the-knee amputation. Cutaneous skin staples are present. Small locular of air are noted inferior to the tibial osteotomy site. There is diffuse soft tissue swelling of the stump soft tissues. No irregularity of the osseous margins to suggest osseous involvement. Mild degenerative changes in the knee joint. No knee joint effusion. Atherosclerotic vascular calcifications are noted.  IMPRESSION: 1. Soft tissue swelling and a small amount of the subcutaneous air in the amputation stump overlying the tibial osteotomy site. Depending on how recent the amputation was, these may represent normal postoperative findings or an infectious process. 2. No evidence of osseous involvement. The osteotomy sites remain sharp and well-defined.   Electronically Signed   By: Malachy MoanHeath  McCullough M.D.   On: 07/27/2013 18:40   Medications: I have reviewed the patient's current medications. Scheduled Meds: . ceFEPime (MAXIPIME) IV  1 g Intravenous Q8H  . heparin  5,000 Units Subcutaneous 3 times per day  . insulin aspart  0-15 Units Subcutaneous TID WC  . insulin aspart  0-5 Units Subcutaneous QHS  . insulin glargine  15 Units Subcutaneous QHS  . methocarbamol  500 mg Oral 3 times per day  . simvastatin  40 mg Oral QHS  . vancomycin  750 mg Intravenous Q12H   Continuous Infusions:  PRN Meds:.morphine injection, oxyCODONE-acetaminophen  Assessment/Plan: 63 year old gentleman, with past medical history of diabetes, CAD, hypertension, and left BKA in 11/2012, presents with fevers, chills, which started the morning of admission during his orthopedic's office visit. He had revision of his left BKA outpatient site on 07/21/2013.   #Sepsis 2/2 cellulitis/SSI of left BKA stump: Sepsis has resolved. Exam significant for left stump that is erythematous, tender, small amount of purulent  drainage from medial sutures. It is not malodorous. Lactate down trended to normal today status post IV fluid resuscitation. Afebrile this admission. Wound culture with rare gram-positive cocci in pairs, rare white blood cells. Blood and urine cultures are NGTD. Pt seen by Orthopedist, Dr. Lajoyce Cornersuda who feels that this infection was likely precipitated from his poorly controlled diabetes. Infection appears unchanged today. Still with purulent serosanguinous drainage from medial aspect. - F/u with Ortho, appreciate recommendations - Continue IV vancomycin and cefepime for coverage of MRSA, gram negatives and Pseudomonas - Keep left leg elevated  - Percocet 1-2 tabs every 4 hours as needed, morphine 1-2 mg IV every 4 hours as needed, home Robaxin 500mg  3 times a day - BMP, CBC in am - PT/OT  #Type 2 diabetes: Last A1c on 07/22/2013 was 6.8%. Patient on Lantus 10 units qhs and SSI. CBGs elevated on admission. Increasing Lantus 15u qhs and increasing SSI to moderate.  - SSI mod - CBGs AC &HS - Lantus at 15 units qhs. - Carb modified diet   Glucose-Capillary  Date Value Ref Range Status  07/29/2013 207* 70 - 99 mg/dL Final  9/5/62134/07/2013 086276* 70 - 99 mg/dL Final  5/7/84694/06/2013 629263* 70 - 99 mg/dL Final  5/2/84134/06/2013 244317* 70 - 99 mg/dL Final  0/1/02724/06/2013 536246* 70 - 99 mg/dL Final    #HTN: Pt on metoprolol and Imdur at home, which were held on admission 2/2 hypotension. In the setting of sepsis. BP elevated today. Restarting home meds. - Restarting Imdur 30mg  daily and metoprolol succinate 25 mg BID  #Acute normocytic anemia: Stable. Likely dilutional from IV fluid resuscitation. - Continue to monitor  Recent Labs Lab  07/27/13 1721 07/28/13 0413 07/29/13 0510  HGB 10.7* 8.8* 8.9*    #CAD: Stable. Troponins were negative x2. EKG unremarkable. Held home Imdur 30 mg daily, metoprolol 25 mg twice a day given soft pressures on admission and no history of heart failure. Restarting today as BP elevated. - Restarting  Toprol-XL and Imdur - Continue simvastatin 40 mg daily  #DVT PPX - Heparin subq  Dispo: Disposition is deferred at this time, awaiting improvement of current medical problems.  Anticipated discharge in approximately 1-3 day(s).   The patient does not have a current PCP (No Pcp Per Patient) and does not need an [facility] hospital follow-up appointment after discharge.  The patient does not have transportation limitations that hinder transportation to clinic appointments.  .Services Needed at time of discharge: Y = Yes, Blank = No PT:   OT:   RN:   Equipment:   Other:     LOS: 2 days   Genelle Gather, MD 07/29/2013, 2:26 PM

## 2013-07-29 NOTE — Progress Notes (Signed)
PT Cancellation Note  Patient Details Name: Ryan CuretJemi Owens MRN: [id number] DOB: [birth year]   Cancelled Treatment:    Reason Eval/Treat Not Completed: Patient not medically ready.   Patient's BP still elevated and glucose not controlled.  Patient requested to defer PT today.  Will check tomorrow.   Olivia CanterMoton, Umaiza Matusik M 07/29/2013, 9:23 AM

## 2013-07-29 NOTE — Progress Notes (Signed)
ANTIBIOTIC CONSULT NOTE - INITIAL  Pharmacy Consult for vancomycin and cefepime Indication: L BKA wound infection, cellulitis  Allergies  Allergen Reactions  . Gardenia Jasminoides Fruit   . Kiwi Extract   . Mango Butter   . Other     Med thru epidural-unknown med Ragweed,milkweed  . Penicillins Rash    Patient Measurements: Height: 5' 10.87" (180 cm) Weight: 232 lb 5.8 oz (105.4 kg) IBW/kg (Calculated) : 74.99  Vital Signs: Temp: 98.1 F (36.7 C) (04/04 0544) Temp src: Oral (04/04 0544) BP: 146/78 mmHg (04/04 0544) Pulse Rate: 85 (04/04 0544) Intake/Output from previous day: 04/03 0701 - 04/04 0700 In: 2520 [P.O.:1320; I.V.:1050; IV Piggyback:150] Out: 3025 [Urine:3025] Intake/Output from this shift: Total I/O In: -  Out: 900 [Urine:900]  Labs:  Recent Labs  07/27/13 1721 07/28/13 0413 07/29/13 0510  WBC 11.6* 6.8 6.2  HGB 10.7* 8.8* 8.9*  PLT 254 193 190  CREATININE 1.34 1.04 0.92   Estimated Creatinine Clearance: 101.4 ml/min (by C-G formula based on Cr of 0.92).  Recent Labs  07/29/13 0840  VANCOTROUGH 9.0*     Microbiology: Recent Results (from the past 720 hour(s))  WOUND CULTURE     Status: None   Collection Time    07/27/13  5:26 PM      Result Value Ref Range Status   Specimen Description WOUND LEFT LEG   Final   Special Requests NONE   Final   Gram Stain     Final   Value: RARE WBC PRESENT, PREDOMINANTLY PMN     NO SQUAMOUS EPITHELIAL CELLS SEEN     RARE GRAM POSITIVE COCCI     IN PAIRS     Performed at [facility]   Culture     Final   Value: Culture reincubated for better growth     Performed at [facility]   Report Status PENDING   Incomplete  CULTURE, BLOOD (ROUTINE X 2)     Status: None   Collection Time    07/27/13  6:25 PM      Result Value Ref Range Status   Specimen Description BLOOD ARM RIGHT   Final   Special Requests BOTTLES DRAWN AEROBIC AND ANAEROBIC 10CC   Final   Culture  Setup Time     Final    Value: 07/28/2013 00:24     Performed at [facility]   Culture     Final   Value:        BLOOD CULTURE RECEIVED NO GROWTH TO DATE CULTURE WILL BE HELD FOR 5 DAYS BEFORE ISSUING A FINAL NEGATIVE REPORT     Performed at [facility]   Report Status PENDING   Incomplete  CULTURE, BLOOD (ROUTINE X 2)     Status: None   Collection Time    07/27/13  6:30 PM      Result Value Ref Range Status   Specimen Description BLOOD HAND RIGHT   Final   Special Requests BOTTLES DRAWN AEROBIC ONLY 1CC   Final   Culture  Setup Time     Final   Value: 07/28/2013 00:24     Performed at [facility]   Culture     Final   Value:        BLOOD CULTURE RECEIVED NO GROWTH TO DATE CULTURE WILL BE HELD FOR 5 DAYS BEFORE ISSUING A FINAL NEGATIVE REPORT     Performed at [facility]   Report Status PENDING  Incomplete  URINE CULTURE     Status: None   Collection Time    07/27/13  8:21 PM      Result Value Ref Range Status   Specimen Description URINE, CLEAN CATCH   Final   Special Requests NONE   Final   Culture  Setup Time     Final   Value: 07/28/2013 03:26     [facility]   Colony Count     Final   Value: NO GROWTH     [facility]   Culture     Final   Value: NO GROWTH     [facility]   Report Status 07/29/2013 FINAL   Final     Assessment: 63 y/o male on vancomycin and cefepime for BKA cellulitis.  He is afebrile and cultures are pending. Vancomycin trough is 9 mg/L this morning on 750mg  IV q12.  Creatinine is stable at 0.92.  Goal of Therapy:  Vancomycin trough 10-15 mg/L  Plan:  - Continue Vancomycin  750 mg IV q12h and cefepime 1g IV q8h - Monitor renal function, clinical progress, and culture data  Celedonio MiyamotoJeremy Madoline Bhatt, PharmD, BCPS Clinical Pharmacist Pager [phone]   07/29/2013 12:26 PM

## 2013-07-30 LAB — BASIC METABOLIC PANEL
BUN: 14 mg/dL (ref 6–23)
CALCIUM: 8.8 mg/dL (ref 8.4–10.5)
CO2: 23 mEq/L (ref 19–32)
Chloride: 100 mEq/L (ref 96–112)
Creatinine, Ser: 1.01 mg/dL (ref 0.50–1.35)
GFR calc Af Amer: 89 mL/min — ABNORMAL LOW (ref >90)
GFR, EST NON AFRICAN AMERICAN: 77 mL/min — AB (ref >90)
Glucose, Bld: 181 mg/dL — ABNORMAL HIGH (ref 70–99)
Potassium: 4.8 mEq/L (ref 3.7–5.3)
Sodium: 137 mEq/L (ref 137–147)

## 2013-07-30 LAB — CBC
HCT: 28.6 % — ABNORMAL LOW (ref 39.0–52.0)
Hemoglobin: 9.9 g/dL — ABNORMAL LOW (ref 13.0–17.0)
MCH: 29.2 pg (ref 26.0–34.0)
MCHC: 34.6 g/dL (ref 30.0–36.0)
MCV: 84.4 fL (ref 78.0–100.0)
PLATELETS: 229 10*3/uL (ref 150–400)
RBC: 3.39 MIL/uL — ABNORMAL LOW (ref 4.22–5.81)
RDW: 13.7 % (ref 11.5–15.5)
WBC: 6.5 10*3/uL (ref 4.0–10.5)

## 2013-07-30 LAB — GLUCOSE, CAPILLARY
Glucose-Capillary: 203 mg/dL — ABNORMAL HIGH (ref 70–99)
Glucose-Capillary: 226 mg/dL — ABNORMAL HIGH (ref 70–99)
Glucose-Capillary: 250 mg/dL — ABNORMAL HIGH (ref 70–99)
Glucose-Capillary: 251 mg/dL — ABNORMAL HIGH (ref 70–99)

## 2013-07-30 NOTE — Progress Notes (Signed)
Subjective:  Patient reports no events  Objective:   VITALS:   Filed Vitals:   07/29/13 1429 07/29/13 2211 07/30/13 0500 07/30/13 0508  BP: 171/86 140/70  141/82  Pulse: 87 78  76  Temp: 99.1 F (37.3 C) 97.5 F (36.4 C)  98.2 F (36.8 C)  TempSrc: Oral Oral  Oral  Resp: 16 20  16   Height:      Weight:   77 kg (169 lb 12.1 oz)   SpO2: 98% 100%  99%    Cellulitis improved Serosanguinous drainage Tender to touch   Lab Results  Component Value Date   WBC 6.5 07/30/2013   HGB 9.9* 07/30/2013   HCT 28.6* 07/30/2013   MCV 84.4 07/30/2013   PLT 229 07/30/2013     Assessment/Plan:  - elevate extremity - DVT ppx - SCDs, ambulation, sub q heparin - NWB left and lower extremity - continue IV abx - glucose control - continue to follow  Problem List Items Addressed This Visit   None    Visit Diagnoses   Wound infection after surgery    -  Primary    Relevant Medications       vancomycin (VANCOCIN) IVPB 750 mg/150 ml premix    Uncontrolled diabetes mellitus        Relevant Medications       insulin aspart (novoLOG) injection 10 Units (Completed)       simvastatin (ZOCOR) tablet 40 mg       insulin glargine (LANTUS) injection 15 Units       insulin aspart (novoLOG) injection 0-5 Units       insulin aspart (novoLOG) injection 0-15 Units        Cheral AlmasXu, Ermelinda Eckert Michael 07/30/2013, 8:49 AM [phone]

## 2013-07-30 NOTE — Progress Notes (Signed)
Subjective: Patient states that he feels better this morning. He states that his pain is improved and the erythema the wound is improved. He denies chest pain or shortness of breath. He states that his stump was re-dresssed this morning.   Objective: Vital signs in last 24 hours: Filed Vitals:   07/29/13 2211 07/30/13 0500 07/30/13 0508 07/30/13 0935  BP: 140/70  141/82 134/74  Pulse: 78  76   Temp: 97.5 F (36.4 C)  98.2 F (36.8 C)   TempSrc: Oral  Oral   Resp: 20  16   Height:      Weight:  169 lb 12.1 oz (77 kg)    SpO2: 100%  99%    Weight change: -64 lb 2.5 oz (-29.1 kg)  Intake/Output Summary (Last 24 hours) at 07/30/13 1336 Last data filed at 07/30/13 0336  Gross per 24 hour  Intake    400 ml  Output   1700 ml  Net  -1300 ml   Physical Exam:  GENERAL- Sitting up in bed. NAD.  HEENT- NCAT, PERRL, EOMI CARDIAC- RRR RESP- Respirations equal ABDOMEN- Soft, nontender, no palpable masses or organomegaly, bowel sounds present.  NEURO- CN 2-12 grossly intact, non focal EXTREMITIES- Moves all 4 ext. LLE BKA, well wrapped with ace bandage. No drainage on the dressing.  PSYCH- Normal mood and affect.  Lab Results: Basic Metabolic Panel:  Recent Labs Lab 07/29/13 0510 07/30/13 0455  NA 135* 137  K 4.5 4.8  CL 99 100  CO2 22 23  GLUCOSE 231* 181*  BUN 15 14  CREATININE 0.92 1.01  CALCIUM 8.5 8.8   Liver Function Tests:  Recent Labs Lab 07/27/13 1721 07/28/13 0413  AST 16 10  ALT 15 12  ALKPHOS 71 61  BILITOT 0.6 0.3  PROT 7.1 6.2  ALBUMIN 3.0* 2.7*   CBC:  Recent Labs Lab 07/27/13 1721  07/29/13 0510 07/30/13 0455  WBC 11.6*  < > 6.2 6.5  NEUTROABS 8.1*  --   --   --   HGB 10.7*  < > 8.9* 9.9*  HCT 29.9*  < > 25.6* 28.6*  MCV 84.7  < > 85.3 84.4  PLT 254  < > 190 229  < > = values in this interval not displayed. Cardiac Enzymes:  Recent Labs Lab 07/27/13 1721 07/28/13 0805  TROPONINI <0.30 <0.30   CBG:  Recent Labs Lab  07/29/13 0646 07/29/13 1059 07/29/13 1555 07/29/13 2208 07/30/13 0702 07/30/13 1119  GLUCAP 276* 207* 206* 215* 203* 226*   Hemoglobin A1C: No results found for this basename: HGBA1C,  in the last 168 hours Coagulation: No results found for this basename: LABPROT, INR,  in the last 168 hours Urinalysis:  Recent Labs Lab 07/27/13 2019  COLORURINE YELLOW  LABSPEC 1.017  PHURINE 6.5  GLUCOSEU 100*  HGBUR NEGATIVE  BILIRUBINUR NEGATIVE  KETONESUR NEGATIVE  PROTEINUR 100*  UROBILINOGEN 0.2  NITRITE NEGATIVE  LEUKOCYTESUR NEGATIVE    Micro Results: Recent Results (from the past 240 hour(s))  WOUND CULTURE     Status: None   Collection Time    07/27/13  5:26 PM      Result Value Ref Range Status   Specimen Description WOUND LEFT LEG   Final   Special Requests NONE   Final   Gram Stain     Final   Value: RARE WBC PRESENT, PREDOMINANTLY PMN     NO SQUAMOUS EPITHELIAL CELLS SEEN     RARE GRAM POSITIVE COCCI  IN PAIRS     Performed at [facility]   Culture     Final   Value: Culture reincubated for better growth     Performed at [facility]   Report Status PENDING   Incomplete  CULTURE, BLOOD (ROUTINE X 2)     Status: None   Collection Time    07/27/13  6:25 PM      Result Value Ref Range Status   Specimen Description BLOOD ARM RIGHT   Final   Special Requests BOTTLES DRAWN AEROBIC AND ANAEROBIC 10CC   Final   Culture  Setup Time     Final   Value: 07/28/2013 00:24     Performed at [facility]   Culture     Final   Value:        BLOOD CULTURE RECEIVED NO GROWTH TO DATE CULTURE WILL BE HELD FOR 5 DAYS BEFORE ISSUING A FINAL NEGATIVE REPORT     Performed at [facility]   Report Status PENDING   Incomplete  CULTURE, BLOOD (ROUTINE X 2)     Status: None   Collection Time    07/27/13  6:30 PM      Result Value Ref Range Status   Specimen Description BLOOD HAND RIGHT   Final   Special Requests BOTTLES DRAWN AEROBIC ONLY 1CC    Final   Culture  Setup Time     Final   Value: 07/28/2013 00:24     Performed at [facility]   Culture     Final   Value:        BLOOD CULTURE RECEIVED NO GROWTH TO DATE CULTURE WILL BE HELD FOR 5 DAYS BEFORE ISSUING A FINAL NEGATIVE REPORT     Performed at [facility]   Report Status PENDING   Incomplete  URINE CULTURE     Status: None   Collection Time    07/27/13  8:21 PM      Result Value Ref Range Status   Specimen Description URINE, CLEAN CATCH   Final   Special Requests NONE   Final   Culture  Setup Time     Final   Value: 07/28/2013 03:26     Performed at [facility]   Colony Count     Final   Value: NO GROWTH     Performed at [facility]   Culture     Final   Value: NO GROWTH     Performed at [facility]   Report Status 07/29/2013 FINAL   Final   Studies/Results: No results found. Medications: I have reviewed the patient's current medications. Scheduled Meds: . ceFEPime (MAXIPIME) IV  1 g Intravenous Q8H  . heparin  5,000 Units Subcutaneous 3 times per day  . insulin aspart  0-15 Units Subcutaneous TID WC  . insulin aspart  0-5 Units Subcutaneous QHS  . insulin glargine  15 Units Subcutaneous QHS  . isosorbide mononitrate  30 mg Oral Daily  . methocarbamol  500 mg Oral 3 times per day  . metoprolol succinate  25 mg Oral Daily  . simvastatin  40 mg Oral QHS  . vancomycin  750 mg Intravenous Q12H   Continuous Infusions:  PRN Meds:.morphine injection, oxyCODONE-acetaminophen  Assessment/Plan: 63 year old gentleman, with past medical history of diabetes, CAD, hypertension, and left BKA in 11/2012, presents with fevers, chills, which started the morning of admission during his orthopedic's office visit. He had revision of his left BKA  outpatient site on 07/21/2013.   #Sepsis 2/2 cellulitis/SSI of left BKA stump: Sepsis has resolved. Admission exam significant for left stump erythema, tenderness to palpation, with a small  amount of purulent drainage from medial sutures that was not malodorous. Lactate down trended to normal status post IV fluid resuscitation. Afebrile this admission. Wound culture with rare gram-positive cocci in pairs, rare white blood cells. Blood and urine cultures are NGTD. Pt seen by Orthopedist, [name] who feels that this infection was likely precipitated from his poorly controlled diabetes. Infection seems improved today.  - F/u with Ortho, appreciate recommendations - Continuing IV vancomycin and cefepime for coverage of MRSA, gram negatives and Pseudomonas - Keep left leg elevated  - Pain control - BMP, CBC in am - PT/OT  #Type 2 diabetes: Last A1c on 07/22/2013 was 6.8%. Patient on Lantus 10 units qhs and SSI. CBGs elevated on admission. Increasing Lantus 20u qhs and continuing SSI to moderate.  - SSI mod - CBGs AC &HS - Lantus at 20 units qhs. - Carb modified diet   Glucose-Capillary  Date Value Ref Range Status  07/30/2013 226* 70 - 99 mg/dL Final  05/02/1094 045* 70 - 99 mg/dL Final  4/0/9811 914* 70 - 99 mg/dL Final  11/02/2954 213* 70 - 99 mg/dL Final  0/11/6576 469* 70 - 99 mg/dL Final    #HTN: Pt on metoprolol and Imdur at home, which were held on admission 2/2 hypotension. In the setting of sepsis. BP stable after restarting home meds. - Continuing Imdur 30mg  daily and metoprolol succinate 25 mg BID  #Acute normocytic anemia: Stable. Likely dilutional from IV fluid resuscitation. - Continue to monitor  Recent Labs Lab 07/27/13 1721 07/28/13 0413 07/29/13 0510 07/30/13 0455  HGB 10.7* 8.8* 8.9* 9.9*    #CAD: Stable. Troponins were negative x2. EKG unremarkable. Held home Imdur 30 mg daily, metoprolol 25 mg twice a day given soft pressures on admission and no history of heart failure. Restarting today as BP elevated. - Restarting Toprol-XL and Imdur - Continue simvastatin 40 mg daily  #DVT PPX - Heparin subq  Dispo: Disposition is deferred at this time, awaiting  improvement of current medical problems.  Anticipated discharge in approximately 1-3 day(s).   The patient does not have a current PCP (No Pcp Per Patient) and does not need an [facility] hospital follow-up appointment after discharge.  The patient does not have transportation limitations that hinder transportation to clinic appointments.  .Services Needed at time of discharge: Y = Yes, Blank = No PT:   OT:   RN:   Equipment:   Other:     LOS: 3 days   Genelle Gather, MD 07/30/2013, 1:36 PM

## 2013-07-30 NOTE — Evaluation (Signed)
Physical Therapy Evaluation Patient Details Name: Ryan Owens MRN: [id number] DOB: January 25, 1951 Today's Date: 07/30/2013   History of Present Illness  Ryan Owens is a 63 y.o. male with a history of DM and HTN who presented to the Emergency Department for a wound check. 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 He was having the wound cleaned and dressed three times a day and was having PT done until he was dropped two days ago. He states that the wound reopened and has been draining since  Clinical Impression  Patient is modified independent for all bed mobility and tranfers to/from w/c. Patient does not want to attempt standing/ambulation at this time due to small wound on right foot and still working to get BP/glucose lowered.  Patient has necessary equipment and has a ramped entrance into house.  No further acute PT needs identified.  Will sign off.  Patient will benefit from f/u therapy at home or OP once he is ready to attempt ambulation.    Follow Up Recommendations Supervision - Intermittent;Home health PT    Equipment Recommendations       Recommendations for Other Services       Precautions / Restrictions Precautions Precautions: Fall Precaution Comments: pt was doing standing exercises in [place] when he fell with PT  in [place] Restrictions LLE Weight Bearing: Non weight bearing      Mobility  Bed Mobility Overal bed mobility: Independent                Transfers Overall transfer level: Modified independent Equipment used: None Transfers: Squat Pivot Transfers     Squat pivot transfers: Modified independent (Device/Increase time)        Ambulation/Gait             General Gait Details: NT due to wound on R foot  Stairs            Wheelchair Mobility    Modified Rankin (Stroke Patients Only)       Balance Overall balance assessment: No apparent balance deficits (not formally  assessed)                                           Pertinent Vitals/Pain Denies pain.    Home Living Family/patient expects to be discharged to:: Private residence Living Arrangements: Other relatives Available Help at Discharge: Family;Available PRN/intermittently Type of Home: House Home Access: Ramped entrance     Home Layout: Laundry or work area in basement;Two level;Able to live on main level with bedroom/bathroom Home Equipment: Wheelchair - manual Additional Comments: pt lives if [place], plans to d/c home with sister here in [place]. She has a ramp      Prior Function Level of Independence: Independent (before spider bite)               Hand Dominance        Extremity/Trunk Assessment   Upper Extremity Assessment: Overall WFL for tasks assessed           Lower Extremity Assessment: Overall WFL for tasks assessed RLE Deficits / Details: WFL ROM and strength, pt with small wound on ball of right foot so hopping not that leg not safe at this point. Pt needs to be transfer level only. Wears diabetic shoe on right foot but needs a new one.  Cervical / Trunk Assessment: Normal  Communication   Communication: No difficulties  Cognition Arousal/Alertness: Awake/alert Behavior During Therapy: WFL for tasks assessed/performed Overall Cognitive Status: Within Functional Limits for tasks assessed                      General Comments      Exercises Amputee Exercises Quad Sets: AROM;Left;10 reps;Seated Gluteal Sets: AROM;Left;10 reps;Seated Hip Extension: AROM;Left;10 reps;Seated Hip ABduction/ADduction: AROM;Left;10 reps;Seated Knee Flexion: AROM;Left;10 reps;Seated Knee Extension: AROM;Left;10 reps;Seated Straight Leg Raises: AROM;Left;10 reps;Seated      Assessment/Plan    PT Assessment All further PT needs can be met in the next venue of care  PT Diagnosis Difficulty walking;Acute pain   PT Problem List Decreased  mobility;Pain;Decreased knowledge of use of DME;Impaired sensation  PT Treatment Interventions     PT Goals (Current goals can be found in the Care Plan section) Acute Rehab PT Goals Patient Stated Goal: be out of the hospital for the next holiday PT Goal Formulation: No goals set, d/c therapy    Frequency     Barriers to discharge        Co-evaluation               End of Session   Activity Tolerance: Patient tolerated treatment well Patient left: in chair;with call bell/phone within reach           Time: 0950-1002 PT Time Calculation (min): 12 min   Charges:   PT Evaluation $Initial PT Evaluation Tier I: 1 Procedure     PT G CodesShanna Owens, [place] 07/30/2013, 10:12 AM

## 2013-07-31 ENCOUNTER — Inpatient Hospital Stay (HOSPITAL_COMMUNITY): Payer: Medicare Other | Admitting: Certified Registered"

## 2013-07-31 ENCOUNTER — Encounter (HOSPITAL_COMMUNITY): Payer: Self-pay | Admitting: Anesthesiology

## 2013-07-31 ENCOUNTER — Encounter (HOSPITAL_COMMUNITY): Payer: Medicare Other | Admitting: Certified Registered"

## 2013-07-31 ENCOUNTER — Encounter (HOSPITAL_COMMUNITY)
Admission: EM | Disposition: A | Discharge status: Home Health | Payer: Self-pay | Source: Home / Self Care | Attending: Internal Medicine

## 2013-07-31 DIAGNOSIS — D649 Anemia, unspecified: Secondary | ICD-10-CM

## 2013-07-31 DIAGNOSIS — I10 Essential (primary) hypertension: Secondary | ICD-10-CM

## 2013-07-31 HISTORY — PX: STUMP REVISION: SHX6102

## 2013-07-31 LAB — CBC
HCT: 30.5 % — ABNORMAL LOW (ref 39.0–52.0)
HEMOGLOBIN: 10.5 g/dL — AB (ref 13.0–17.0)
MCH: 29.1 pg (ref 26.0–34.0)
MCHC: 34.4 g/dL (ref 30.0–36.0)
MCV: 84.5 fL (ref 78.0–100.0)
Platelets: 269 10*3/uL (ref 150–400)
RBC: 3.61 MIL/uL — ABNORMAL LOW (ref 4.22–5.81)
RDW: 13.6 % (ref 11.5–15.5)
WBC: 6.4 10*3/uL (ref 4.0–10.5)

## 2013-07-31 LAB — GLUCOSE, CAPILLARY
GLUCOSE-CAPILLARY: 159 mg/dL — AB (ref 70–99)
Glucose-Capillary: 218 mg/dL — ABNORMAL HIGH (ref 70–99)
Glucose-Capillary: 198 mg/dL — ABNORMAL HIGH (ref 70–99)
Glucose-Capillary: 185 mg/dL — ABNORMAL HIGH (ref 70–99)
Glucose-Capillary: 207 mg/dL — ABNORMAL HIGH (ref 70–99)

## 2013-07-31 LAB — BASIC METABOLIC PANEL
BUN: 20 mg/dL (ref 6–23)
CHLORIDE: 98 meq/L (ref 96–112)
CO2: 25 mEq/L (ref 19–32)
CREATININE: 1.02 mg/dL (ref 0.50–1.35)
Calcium: 9.3 mg/dL (ref 8.4–10.5)
GFR calc Af Amer: 88 mL/min — ABNORMAL LOW (ref >90)
GFR, EST NON AFRICAN AMERICAN: 76 mL/min — AB (ref >90)
Glucose, Bld: 211 mg/dL — ABNORMAL HIGH (ref 70–99)
POTASSIUM: 4.9 meq/L (ref 3.7–5.3)
Sodium: 137 mEq/L (ref 137–147)

## 2013-07-31 LAB — SURGICAL PCR SCREEN
MRSA, PCR: NEGATIVE
STAPHYLOCOCCUS AUREUS: NEGATIVE

## 2013-07-31 SURGERY — REVISION, AMPUTATION SITE
Anesthesia: General | Site: Leg Lower | Laterality: Left

## 2013-07-31 MED ORDER — OXYCODONE HCL 5 MG PO TABS
5.0000 mg | ORAL_TABLET | Freq: Once | ORAL | Status: AC | PRN
  Administered 2013-07-31: 5 mg via ORAL

## 2013-07-31 MED ORDER — ALBUMIN HUMAN 5 % IV SOLN
INTRAVENOUS | Status: DC | PRN
  Administered 2013-07-31: 18:00:00 via INTRAVENOUS

## 2013-07-31 MED ORDER — ONDANSETRON HCL 4 MG/2ML IJ SOLN: 4.0000 mg | Freq: Four times a day (QID) | INTRAMUSCULAR | Status: DC | PRN

## 2013-07-31 MED ORDER — CLINDAMYCIN PHOSPHATE 900 MG/50ML IV SOLN
900.0000 mg | INTRAVENOUS | Status: AC
  Administered 2013-07-31: 900 mg via INTRAVENOUS
  Filled 2013-07-31: qty 50

## 2013-07-31 MED ORDER — HYDROMORPHONE HCL PF 1 MG/ML IJ SOLN
INTRAMUSCULAR | Status: AC
  Filled 2013-07-31: qty 1

## 2013-07-31 MED ORDER — MIDAZOLAM HCL 2 MG/2ML IJ SOLN
INTRAMUSCULAR | Status: AC
  Filled 2013-07-31: qty 2

## 2013-07-31 MED ORDER — FENTANYL CITRATE 0.05 MG/ML IJ SOLN
INTRAMUSCULAR | Status: DC | PRN
  Administered 2013-07-31: 25 ug via INTRAVENOUS
  Administered 2013-07-31: 100 ug via INTRAVENOUS
  Administered 2013-07-31 (×2): 50 ug via INTRAVENOUS
  Administered 2013-07-31: 25 ug via INTRAVENOUS

## 2013-07-31 MED ORDER — ARTIFICIAL TEARS OP OINT
TOPICAL_OINTMENT | OPHTHALMIC | Status: DC | PRN
  Administered 2013-07-31: 1 via OPHTHALMIC

## 2013-07-31 MED ORDER — LIDOCAINE HCL (CARDIAC) 20 MG/ML IV SOLN
INTRAVENOUS | Status: AC
  Filled 2013-07-31: qty 5

## 2013-07-31 MED ORDER — OXYCODONE-ACETAMINOPHEN 5-325 MG PO TABS
ORAL_TABLET | ORAL | Status: AC
  Filled 2013-07-31: qty 2

## 2013-07-31 MED ORDER — METOCLOPRAMIDE HCL 5 MG/ML IJ SOLN: 5.0000 mg | Freq: Three times a day (TID) | INTRAMUSCULAR | Status: DC | PRN

## 2013-07-31 MED ORDER — OXYCODONE HCL 5 MG PO TABS
ORAL_TABLET | ORAL | Status: AC
  Filled 2013-07-31: qty 1

## 2013-07-31 MED ORDER — PHENYLEPHRINE HCL 10 MG/ML IJ SOLN
10.0000 mg | INTRAVENOUS | Status: DC | PRN
  Administered 2013-07-31: 40 ug/min via INTRAVENOUS

## 2013-07-31 MED ORDER — 0.9 % SODIUM CHLORIDE (POUR BTL) OPTIME
TOPICAL | Status: DC | PRN
  Administered 2013-07-31: 1000 mL

## 2013-07-31 MED ORDER — ONDANSETRON HCL 4 MG/2ML IJ SOLN
INTRAMUSCULAR | Status: DC | PRN
  Administered 2013-07-31: 4 mg via INTRAVENOUS

## 2013-07-31 MED ORDER — METOCLOPRAMIDE HCL 10 MG PO TABS: 5.0000 mg | ORAL_TABLET | Freq: Three times a day (TID) | ORAL | Status: DC | PRN

## 2013-07-31 MED ORDER — PROPOFOL 10 MG/ML IV BOLUS
INTRAVENOUS | Status: DC | PRN
  Administered 2013-07-31: 170 mg via INTRAVENOUS

## 2013-07-31 MED ORDER — ONDANSETRON HCL 4 MG/2ML IJ SOLN
INTRAMUSCULAR | Status: AC
  Filled 2013-07-31: qty 6

## 2013-07-31 MED ORDER — HYDROMORPHONE HCL PF 1 MG/ML IJ SOLN: 0.5000 mg | INTRAMUSCULAR | Status: DC | PRN

## 2013-07-31 MED ORDER — HEPARIN SODIUM (PORCINE) 5000 UNIT/ML IJ SOLN
5000.0000 [IU] | Freq: Three times a day (TID) | INTRAMUSCULAR | Status: DC
  Administered 2013-08-01 – 2013-08-03 (×6): 5000 [IU] via SUBCUTANEOUS
  Filled 2013-07-31 (×9): qty 1

## 2013-07-31 MED ORDER — CHLORHEXIDINE GLUCONATE 4 % EX LIQD
60.0000 mL | Freq: Once | CUTANEOUS | Status: AC
  Administered 2013-07-31: 4 via TOPICAL
  Filled 2013-07-31: qty 60

## 2013-07-31 MED ORDER — ONDANSETRON HCL 4 MG PO TABS: 4.0000 mg | ORAL_TABLET | Freq: Four times a day (QID) | ORAL | Status: DC | PRN

## 2013-07-31 MED ORDER — FENTANYL CITRATE 0.05 MG/ML IJ SOLN
INTRAMUSCULAR | Status: AC
  Filled 2013-07-31: qty 5

## 2013-07-31 MED ORDER — PHENYLEPHRINE HCL 10 MG/ML IJ SOLN
INTRAMUSCULAR | Status: AC
  Filled 2013-07-31: qty 1

## 2013-07-31 MED ORDER — LIDOCAINE HCL (CARDIAC) 20 MG/ML IV SOLN
INTRAVENOUS | Status: DC | PRN
  Administered 2013-07-31: 60 mg via INTRAVENOUS

## 2013-07-31 MED ORDER — HYDROMORPHONE HCL PF 1 MG/ML IJ SOLN
0.2500 mg | INTRAMUSCULAR | Status: DC | PRN
  Administered 2013-07-31 (×3): 0.5 mg via INTRAVENOUS

## 2013-07-31 MED ORDER — METOCLOPRAMIDE HCL 5 MG/ML IJ SOLN
10.0000 mg | Freq: Once | INTRAMUSCULAR | Status: DC | PRN
Start: 2013-07-31 — End: 2013-07-31

## 2013-07-31 MED ORDER — OXYCODONE HCL 5 MG/5ML PO SOLN: 5.0000 mg | Freq: Once | ORAL | Status: AC | PRN

## 2013-07-31 MED ORDER — PHENYLEPHRINE HCL 10 MG/ML IJ SOLN
INTRAMUSCULAR | Status: DC | PRN
  Administered 2013-07-31: 80 ug via INTRAVENOUS
  Administered 2013-07-31: 120 ug via INTRAVENOUS
  Administered 2013-07-31: 80 ug via INTRAVENOUS

## 2013-07-31 MED ORDER — MIDAZOLAM HCL 5 MG/5ML IJ SOLN
INTRAMUSCULAR | Status: DC | PRN
  Administered 2013-07-31 (×2): 1 mg via INTRAVENOUS

## 2013-07-31 MED ORDER — HYDROMORPHONE HCL PF 1 MG/ML IJ SOLN
0.5000 mg | INTRAMUSCULAR | Status: AC | PRN
  Administered 2013-07-31 (×4): 0.5 mg via INTRAVENOUS

## 2013-07-31 MED ORDER — LACTATED RINGERS IV SOLN
INTRAVENOUS | Status: DC | PRN
  Administered 2013-07-31: 17:00:00 via INTRAVENOUS

## 2013-07-31 MED ORDER — SODIUM CHLORIDE 0.9 % IV SOLN
INTRAVENOUS | Status: DC
  Administered 2013-07-31: 20 mL/h via INTRAVENOUS

## 2013-07-31 SURGICAL SUPPLY — 52 items
BANDAGE ELASTIC 6 VELCRO ST LF (GAUZE/BANDAGES/DRESSINGS) ×3 IMPLANT
BANDAGE ESMARK 6X9 LF (GAUZE/BANDAGES/DRESSINGS) ×1 IMPLANT
BANDAGE GAUZE ELAST BULKY 4 IN (GAUZE/BANDAGES/DRESSINGS) ×3 IMPLANT
BLADE SAW RECIP 87.9 MT (BLADE) ×3 IMPLANT
BNDG COHESIVE 6X5 TAN STRL LF (GAUZE/BANDAGES/DRESSINGS) ×3 IMPLANT
BNDG ESMARK 6X9 LF (GAUZE/BANDAGES/DRESSINGS) ×3
BNDG GAUZE STRTCH 6 (GAUZE/BANDAGES/DRESSINGS) IMPLANT
COVER SURGICAL LIGHT HANDLE (MISCELLANEOUS) ×3 IMPLANT
CUFF TOURNIQUET SINGLE 34IN LL (TOURNIQUET CUFF) IMPLANT
DRAIN PENROSE 1/2X12 LTX STRL (WOUND CARE) IMPLANT
DRAPE EXTREMITY T 121X128X90 (DRAPE) ×3 IMPLANT
DRAPE PROXIMA HALF (DRAPES) ×6 IMPLANT
DRAPE U-SHAPE 47X51 STRL (DRAPES) ×6 IMPLANT
DRSG ADAPTIC 3X8 NADH LF (GAUZE/BANDAGES/DRESSINGS) ×3 IMPLANT
DURAPREP 26ML APPLICATOR (WOUND CARE) ×3 IMPLANT
ELECT CAUTERY BLADE 6.4 (BLADE) ×3 IMPLANT
ELECT REM PT RETURN 9FT ADLT (ELECTROSURGICAL) ×3
ELECTRODE REM PT RTRN 9FT ADLT (ELECTROSURGICAL) ×1 IMPLANT
EVACUATOR 1/8 PVC DRAIN (DRAIN) IMPLANT
GLOVE BIOGEL PI IND STRL 7.5 (GLOVE) ×1 IMPLANT
GLOVE BIOGEL PI IND STRL 9 (GLOVE) ×1 IMPLANT
GLOVE BIOGEL PI INDICATOR 7.5 (GLOVE) ×2
GLOVE BIOGEL PI INDICATOR 9 (GLOVE) ×2
GLOVE ECLIPSE 7.0 STRL STRAW (GLOVE) ×3 IMPLANT
GLOVE SURG ORTHO 9.0 STRL STRW (GLOVE) ×3 IMPLANT
GOWN STRL REUS W/ TWL XL LVL3 (GOWN DISPOSABLE) ×2 IMPLANT
GOWN STRL REUS W/TWL XL LVL3 (GOWN DISPOSABLE) ×4
KIT BASIN OR (CUSTOM PROCEDURE TRAY) ×3 IMPLANT
KIT ROOM TURNOVER OR (KITS) ×3 IMPLANT
MANIFOLD NEPTUNE II (INSTRUMENTS) ×3 IMPLANT
NS IRRIG 1000ML POUR BTL (IV SOLUTION) ×3 IMPLANT
PACK GENERAL/GYN (CUSTOM PROCEDURE TRAY) ×3 IMPLANT
PAD ABD 8X10 STRL (GAUZE/BANDAGES/DRESSINGS) ×3 IMPLANT
PAD ARMBOARD 7.5X6 YLW CONV (MISCELLANEOUS) ×6 IMPLANT
PAD CAST 4YDX4 CTTN HI CHSV (CAST SUPPLIES) ×1 IMPLANT
PADDING CAST COTTON 4X4 STRL (CAST SUPPLIES) ×2
PADDING CAST COTTON 6X4 STRL (CAST SUPPLIES) ×3 IMPLANT
SPONGE GAUZE 4X4 12PLY (GAUZE/BANDAGES/DRESSINGS) ×3 IMPLANT
SPONGE LAP 18X18 X RAY DECT (DISPOSABLE) ×3 IMPLANT
STAPLER VISISTAT 35W (STAPLE) IMPLANT
STOCKINETTE IMPERVIOUS LG (DRAPES) ×3 IMPLANT
SUT ETHILON 2 0 PSLX (SUTURE) ×9 IMPLANT
SUT PDS AB 1 CT  36 (SUTURE)
SUT PDS AB 1 CT 36 (SUTURE) IMPLANT
SUT SILK 2 0 (SUTURE) ×2
SUT SILK 2-0 18XBRD TIE 12 (SUTURE) ×1 IMPLANT
SUT VIC AB 1 CTX 36 (SUTURE)
SUT VIC AB 1 CTX36XBRD ANBCTR (SUTURE) IMPLANT
TOWEL OR 17X24 6PK STRL BLUE (TOWEL DISPOSABLE) ×3 IMPLANT
TOWEL OR 17X26 10 PK STRL BLUE (TOWEL DISPOSABLE) ×3 IMPLANT
TUBE ANAEROBIC SPECIMEN COL (MISCELLANEOUS) IMPLANT
WATER STERILE IRR 1000ML POUR (IV SOLUTION) ×3 IMPLANT

## 2013-07-31 NOTE — Progress Notes (Signed)
Internal Medicine Attending  Date: 07/31/2013  Patient name: Ryan CuretJemi Owens Medical record number: [id number] Date of birth: Sep 15, 1950 Age: 63 y.o. Gender: male  I saw and evaluated the patient, and discussed his care on A.M rounds with housestaff.  I reviewed the resident's note by Dr. Claudell Kyleater and I agree with the resident's findings and plans as documented in her note.

## 2013-07-31 NOTE — Progress Notes (Signed)
Subjective: Patient seen and examined at the bedside this morning. He is resting in NAD. Feels about the same today. Denies fevers. Still with some discomfort in his stump.  Plan for revision amputation this evening per ortho. NPO today for the procedure.  Objective: Vital signs in last 24 hours: Filed Vitals:   07/30/13 0935 07/30/13 1559 07/30/13 2146 07/31/13 0534  BP: 134/74 134/69 123/74 150/80  Pulse:  81 88 80  Temp:  98.3 F (36.8 C) 98.6 F (37 C) 98.3 F (36.8 C)  TempSrc:   Oral Oral  Resp:  18 16 14   Height:      Weight:      SpO2:  97% 99% 99%   Weight change:   Intake/Output Summary (Last 24 hours) at 07/31/13 0711 Last data filed at 07/30/13 2300  Gross per 24 hour  Intake   1360 ml  Output   2000 ml  Net   -640 ml   Physical Exam:  GENERAL- Sitting up in bed. NAD.  HEENT- NCAT, PERRL, EOMI CARDIAC- RRR RESP- Respirations equal ABDOMEN- Soft, nontender, no palpable masses or organomegaly, bowel sounds present.  NEURO- CN 2-12 grossly intact, non focal EXTREMITIES- Moves all 4 ext. LLE BKA, well wrapped with ace bandage. No drainage on the dressing.  PSYCH- Normal mood and affect.  Lab Results: Basic Metabolic Panel:  Recent Labs Lab 07/29/13 0510 07/30/13 0455  NA 135* 137  K 4.5 4.8  CL 99 100  CO2 22 23  GLUCOSE 231* 181*  BUN 15 14  CREATININE 0.92 1.01  CALCIUM 8.5 8.8   Liver Function Tests:  Recent Labs Lab 07/27/13 1721 07/28/13 0413  AST 16 10  ALT 15 12  ALKPHOS 71 61  BILITOT 0.6 0.3  PROT 7.1 6.2  ALBUMIN 3.0* 2.7*   CBC:  Recent Labs Lab 07/27/13 1721  07/29/13 0510 07/30/13 0455  WBC 11.6*  < > 6.2 6.5  NEUTROABS 8.1*  --   --   --   HGB 10.7*  < > 8.9* 9.9*  HCT 29.9*  < > 25.6* 28.6*  MCV 84.7  < > 85.3 84.4  PLT 254  < > 190 229  < > = values in this interval not displayed. Cardiac Enzymes:  Recent Labs Lab 07/27/13 1721 07/28/13 0805  TROPONINI <0.30 <0.30   CBG:  Recent Labs Lab  07/29/13 1555 07/29/13 2208 07/30/13 0702 07/30/13 1119 07/30/13 1558 07/30/13 2149  GLUCAP 206* 215* 203* 226* 250* 251*   Urinalysis:  Recent Labs Lab 07/27/13 2019  COLORURINE YELLOW  LABSPEC 1.017  PHURINE 6.5  GLUCOSEU 100*  HGBUR NEGATIVE  BILIRUBINUR NEGATIVE  KETONESUR NEGATIVE  PROTEINUR 100*  UROBILINOGEN 0.2  NITRITE NEGATIVE  LEUKOCYTESUR NEGATIVE    Micro Results: Recent Results (from the past 240 hour(s))  WOUND CULTURE     Status: None   Collection Time    07/27/13  5:26 PM      Result Value Ref Range Status   Specimen Description WOUND LEFT LEG   Final   Special Requests NONE   Final   Gram Stain     Final   Value: RARE WBC PRESENT, PREDOMINANTLY PMN     NO SQUAMOUS EPITHELIAL CELLS SEEN     RARE GRAM POSITIVE COCCI     IN PAIRS     Performed at [facility]   Culture     Final   Value: Culture reincubated for better growth     Performed  at [facility]   Report Status PENDING   Incomplete  CULTURE, BLOOD (ROUTINE X 2)     Status: None   Collection Time    07/27/13  6:25 PM      Result Value Ref Range Status   Specimen Description BLOOD ARM RIGHT   Final   Special Requests BOTTLES DRAWN AEROBIC AND ANAEROBIC 10CC   Final   Culture  Setup Time     Final   Value: 07/28/2013 00:24     Performed at [facility]   Culture     Final   Value:        BLOOD CULTURE RECEIVED NO GROWTH TO DATE CULTURE WILL BE HELD FOR 5 DAYS BEFORE ISSUING A FINAL NEGATIVE REPORT     Performed at [facility]   Report Status PENDING   Incomplete  CULTURE, BLOOD (ROUTINE X 2)     Status: None   Collection Time    07/27/13  6:30 PM      Result Value Ref Range Status   Specimen Description BLOOD HAND RIGHT   Final   Special Requests BOTTLES DRAWN AEROBIC ONLY 1CC   Final   Culture  Setup Time     Final   Value: 07/28/2013 00:24     Performed at [facility]   Culture     Final   Value:        BLOOD CULTURE RECEIVED NO GROWTH  TO DATE CULTURE WILL BE HELD FOR 5 DAYS BEFORE ISSUING A FINAL NEGATIVE REPORT     Performed at [facility]   Report Status PENDING   Incomplete  URINE CULTURE     Status: None   Collection Time    07/27/13  8:21 PM      Result Value Ref Range Status   Specimen Description URINE, CLEAN CATCH   Final   Special Requests NONE   Final   Culture  Setup Time     Final   Value: 07/28/2013 03:26     Performed at [facility]   Colony Count     Final   Value: NO GROWTH     Performed at [facility]   Culture     Final   Value: NO GROWTH     Performed at [facility]   Report Status 07/29/2013 FINAL   Final   Studies/Results: No results found. Medications: I have reviewed the patient's current medications. Scheduled Meds: . ceFEPime (MAXIPIME) IV  1 g Intravenous Q8H  . heparin  5,000 Units Subcutaneous 3 times per day  . insulin aspart  0-15 Units Subcutaneous TID WC  . insulin aspart  0-5 Units Subcutaneous QHS  . insulin glargine  15 Units Subcutaneous QHS  . isosorbide mononitrate  30 mg Oral Daily  . methocarbamol  500 mg Oral 3 times per day  . metoprolol succinate  25 mg Oral Daily  . simvastatin  40 mg Oral QHS  . vancomycin  750 mg Intravenous Q12H   Continuous Infusions:  PRN Meds:.morphine injection, oxyCODONE-acetaminophen  Assessment/Plan: 63 year old gentleman, with past medical history of diabetes, CAD, hypertension, and left BKA in 11/2012, presents with fevers, chills, which started the morning of admission during his orthopedic's office visit. He had revision of his left BKA outpatient site on 07/21/2013.   #Sepsis 2/2 cellulitis/SSI of left BKA stump: Sepsis has resolved. Admission exam significant for left stump erythema, tenderness to palpation, with a small amount of purulent  drainage from medial sutures that was not malodorous. Lactate down trended to normal status post IV fluid resuscitation. Afebrile currently. Wound culture with  rare gram-positive cocci in pairs, rare white blood cells. Blood and urine cultures are NGTD. Patient seen by Orthopedist, [name] who feels that this infection was likely precipitated from his poorly controlled diabetes. Amputation site is still with purulent drainage this morning, so plan is for revision amputation this evening. - F/u with Ortho, appreciate recommendations - Continuing IV vancomycin and cefepime for coverage of MRSA, gram negatives and Pseudomonas - Keep left leg elevated  - Pain control - NPO for OR - BMP, CBC in am - PT/OT  #Type 2 diabetes: Last A1c on 07/22/2013 was 6.8%. Patient on Lantus 10 units qhs and SSI. CBGs elevated on admission. Increasing Lantus 20u qhs and continuing SSI to moderate.  - SSI mod - CBGs AC &HS - Lantus at 20 units qhs. - Carb modified diet   Glucose-Capillary  Date Value Ref Range Status  07/30/2013 251* 70 - 99 mg/dL Final  05/02/1094 045* 70 - 99 mg/dL Final  4/0/9811 914* 70 - 99 mg/dL Final  11/02/2954 213* 70 - 99 mg/dL Final  0/11/6576 469* 70 - 99 mg/dL Final    #HTN: Pt on metoprolol and Imdur at home, which were held on admission 2/2 hypotension in the setting of sepsis. BP stable after restarting home meds. - Continuing Imdur 30mg  daily and metoprolol succinate 25 mg BID  #Acute normocytic anemia: Stable. Likely dilutional from IV fluid resuscitation. - Continue to monitor  Recent Labs Lab 07/27/13 1721 07/28/13 0413 07/29/13 0510 07/30/13 0455  HGB 10.7* 8.8* 8.9* 9.9*    #CAD: Stable. Troponins were negative x2. EKG unremarkable. - Continue home Imdur 30 mg daily, metoprolol 25 mg twice a day - Continue simvastatin 40 mg daily  #DVT PPX - Heparin subq  Dispo: Disposition is deferred at this time, awaiting improvement of current medical problems.  Anticipated discharge in approximately 1-3 day(s).   The patient does not have a current PCP (No Pcp Per Patient) and does not need an [facility] hospital follow-up appointment  after discharge.  The patient does not have transportation limitations that hinder transportation to clinic appointments.  .Services Needed at time of discharge: Y = Yes, Blank = No PT: Supervision - Intermittent;Home health PT  OT: OT screened, no needs identified, will sign off.  RN:   Equipment:   Other:     LOS: 4 days   Vivi Barrack, MD 07/31/2013, 7:11 AM  Vivi Barrack, MD  [email or web address] Pager # [phone] Office # [phone]

## 2013-07-31 NOTE — Op Note (Signed)
OPERATIVE REPORT  DATE OF SURGERY: 07/31/2013  PATIENT:  Ryan Owens,  63 y.o. male  PRE-OPERATIVE DIAGNOSIS:  Abscess left below knee amputation  POST-OPERATIVE DIAGNOSIS:  abscess left below knee amputation  PROCEDURE:  Procedure(s): REVISION LEFT BELOW KNEE AMPUTATION left  SURGEON:  Surgeon(s): Nadara MustardMarcus V Duda, MD  ANESTHESIA:   general  EBL:  See anesthesia notes ML  SPECIMEN:  No Specimen  TOURNIQUET:  * No tourniquets in log *  PROCEDURE DETAILS: Patient is a 63 year old gentleman diabetic insensate neuropathy with purulent drainage from his left transtibial amputation. Patient has failed conservative wound care and IV antibiotics and presents at this time for revision amputation. Risks and benefits were discussed including revision amputation. Patient states he understands and wished to proceed at this time. Description of procedure patient was brought to the operating room and underwent a general anesthetic. After adequate levels of anesthesia were obtained patient's left lower extremity was prepped using DuraPrep draped into a sterile field. A fishmouth incision was made around the necrotic wound. This was carried down to the abscess and necrotic tissue extended down to the bone. The distal 2 cm of the tibia and fibular were resected. This was irrigated with normal saline. Hemostasis was obtained. There was good healthy tissue at the base of the wound. The incision was closed using 2-0 nylon. The wound was covered with Adaptic orthopedic sponges AB dressing Kerlix and Coban. Patient was extubated taken to the PACU in stable condition.  PLAN OF CARE: Admit to inpatient   PATIENT DISPOSITION:  PACU - hemodynamically stable.   Nadara MustardUDA,MARCUS V, MD 07/31/2013 6:06 PM

## 2013-07-31 NOTE — Anesthesia Postprocedure Evaluation (Signed)
Anesthesia Post Note  Patient: Ryan Owens  Procedure(s) Performed: Procedure(s) (LRB): REVISION LEFT BELOW KNEE AMPUTATION (Left)  Anesthesia type: general  Patient location: PACU  Post pain: Pain level controlled  Post assessment: Patient's Cardiovascular Status Stable  Last Vitals:  Filed Vitals:   07/31/13 1830  BP:   Pulse: 97  Temp:   Resp: 13    Post vital signs: Reviewed and stable  Level of consciousness: sedated  Complications: No apparent anesthesia complications

## 2013-07-31 NOTE — H&P (View-Only) (Signed)
Patient ID: Ryan Owens, male   DOB: 1951/02/25, 63 y.o.   MRN: [id number] Purulent drainage left transtibial amputation. Patient has failed treatment with IV antibiotics. Plan for revision amputation this evening. N.p.o. today.

## 2013-07-31 NOTE — Progress Notes (Addendum)
Inpatient Diabetes Program Recommendations  AACE/ADA: New Consensus Statement on Inpatient Glycemic Control (2013)  Target Ranges:  Prepandial:   less than 140 mg/dL      Peak postprandial:   less than 180 mg/dL (1-2 hours)      Critically ill patients:  140 - 180 mg/dL   Glucose still out of good control in view of infection/sepsis.  Please consider the following at this time. Inpatient Diabetes Program Recommendations Insulin - Basal: Please continue to increase lantus until fastings are controlled < 150 mg/dL Correction (SSI): Increase to resistant if lantus increase is not effective.  Thank you, Lenor CoffinAnn Winthrop Shannahan, RN, CNS, Diabetes Coordinator [phone])

## 2013-07-31 NOTE — Interval H&P Note (Signed)
History and Physical Interval Note:  07/31/2013 4:59 PM  Ryan Owens  has presented today for surgery, with the diagnosis of Abscess left below knee amputation  The various methods of treatment have been discussed with the patient and family. After consideration of risks, benefits and other options for treatment, the patient has consented to  Procedure(s): REVISION LEFT BELOW KNEE AMPUTATION (Left) as a surgical intervention .  The patient's history has been reviewed, patient examined, no change in status, stable for surgery.  I have reviewed the patient's chart and labs.  Questions were answered to the patient's satisfaction.     DUDA,MARCUS V

## 2013-07-31 NOTE — Anesthesia Preprocedure Evaluation (Addendum)
Anesthesia Evaluation  Patient identified by MRN, date of birth, ID band Patient awake    Reviewed: Allergy & Precautions, H&P , NPO status , Patient's Chart, lab work & pertinent test results, reviewed documented beta blocker date and time   History of Anesthesia Complications (+) history of anesthetic complications  Airway Mallampati: II TM Distance: >3 FB Neck ROM: full    Dental   Pulmonary neg pulmonary ROS, former smoker,  breath sounds clear to auscultation        Cardiovascular hypertension, On Medications and On Home Beta Blockers + CAD and + Cardiac Stents Rhythm:regular     Neuro/Psych negative neurological ROS  negative psych ROS   GI/Hepatic negative GI ROS, Neg liver ROS,   Endo/Other  diabetes, Insulin Dependent, Oral Hypoglycemic Agents  Renal/GU negative Renal ROS  negative genitourinary   Musculoskeletal   Abdominal   Peds  Hematology negative hematology ROS (+)   Anesthesia Other Findings See surgeon's H&P   Reproductive/Obstetrics negative OB ROS                          Anesthesia Physical Anesthesia Plan  ASA: III  Anesthesia Plan: General   Post-op Pain Management:    Induction: Intravenous  Airway Management Planned: LMA  Additional Equipment:   Intra-op Plan:   Post-operative Plan:   Informed Consent: I have reviewed the patients History and Physical, chart, labs and discussed the procedure including the risks, benefits and alternatives for the proposed anesthesia with the patient or authorized representative who has indicated his/her understanding and acceptance.   Dental Advisory Given  Plan Discussed with: CRNA and Surgeon  Anesthesia Plan Comments:         Anesthesia Quick Evaluation

## 2013-07-31 NOTE — Anesthesia Procedure Notes (Signed)
Procedure Name: LMA Insertion Date/Time: 07/31/2013 5:33 PM Performed by: Gayla MedicusHYPES, Xzaria Teo M. Pre-anesthesia Checklist: Patient identified, Patient being monitored, Emergency Drugs available, Timeout performed and Suction available Patient Re-evaluated:Patient Re-evaluated prior to inductionOxygen Delivery Method: Circle system utilized Preoxygenation: Pre-oxygenation with 100% oxygen Intubation Type: IV induction Ventilation: Mask ventilation without difficulty LMA: LMA inserted LMA Size: 4.0 Number of attempts: 1 Placement Confirmation: positive ETCO2 and breath sounds checked- equal and bilateral Tube secured with: Tape Dental Injury: Teeth and Oropharynx as per pre-operative assessment

## 2013-07-31 NOTE — Transfer of Care (Signed)
Immediate Anesthesia Transfer of Care Note  Patient: Ryan Owens  Procedure(s) Performed: Procedure(s): REVISION LEFT BELOW KNEE AMPUTATION (Left)  Patient Location: PACU  Anesthesia Type:General  Level of Consciousness: awake, alert  and oriented  Airway & Oxygen Therapy: Patient Spontanous Breathing and Patient connected to nasal cannula oxygen  Post-op Assessment: Report given to PACU RN and Post -op Vital signs reviewed and stable Post vital signs: Reviewed and stable  Complications: No apparent anesthesia complications

## 2013-07-31 NOTE — Progress Notes (Signed)
Patient ID: Ryan Owens, male   DOB: 02/23/1951, 63 y.o.   MRN: [id number] Purulent drainage left transtibial amputation. Patient has failed treatment with IV antibiotics. Plan for revision amputation this evening. N.p.o. today. 

## 2013-08-01 ENCOUNTER — Inpatient Hospital Stay: Payer: Medicare Other | Admitting: Pharmacist

## 2013-08-01 LAB — BASIC METABOLIC PANEL
BUN: 27 mg/dL — ABNORMAL HIGH (ref 6–23)
CO2: 22 meq/L (ref 19–32)
Calcium: 9.2 mg/dL (ref 8.4–10.5)
Chloride: 96 mEq/L (ref 96–112)
Creatinine, Ser: 1.26 mg/dL (ref 0.50–1.35)
GFR calc Af Amer: 68 mL/min — ABNORMAL LOW (ref >90)
GFR, EST NON AFRICAN AMERICAN: 59 mL/min — AB (ref >90)
GLUCOSE: 256 mg/dL — AB (ref 70–99)
POTASSIUM: 5 meq/L (ref 3.7–5.3)
SODIUM: 134 meq/L — AB (ref 137–147)

## 2013-08-01 LAB — WOUND CULTURE

## 2013-08-01 LAB — CBC
HCT: 22.8 % — ABNORMAL LOW (ref 39.0–52.0)
HEMOGLOBIN: 8.1 g/dL — AB (ref 13.0–17.0)
MCH: 29.9 pg (ref 26.0–34.0)
MCHC: 35.5 g/dL (ref 30.0–36.0)
MCV: 84.1 fL (ref 78.0–100.0)
Platelets: 267 10*3/uL (ref 150–400)
RBC: 2.71 MIL/uL — AB (ref 4.22–5.81)
RDW: 14.1 % (ref 11.5–15.5)
WBC: 8.8 10*3/uL (ref 4.0–10.5)

## 2013-08-01 LAB — GLUCOSE, CAPILLARY
Glucose-Capillary: 227 mg/dL — ABNORMAL HIGH (ref 70–99)
Glucose-Capillary: 270 mg/dL — ABNORMAL HIGH (ref 70–99)
Glucose-Capillary: 277 mg/dL — ABNORMAL HIGH (ref 70–99)
Glucose-Capillary: 199 mg/dL — ABNORMAL HIGH (ref 70–99)

## 2013-08-01 MED ORDER — GABAPENTIN 300 MG PO CAPS
300.0000 mg | ORAL_CAPSULE | Freq: Three times a day (TID) | ORAL | Status: DC
  Administered 2013-08-01 – 2013-08-03 (×7): 300 mg via ORAL
  Filled 2013-08-01 (×9): qty 1

## 2013-08-01 NOTE — Progress Notes (Signed)
ANTIBIOTIC CONSULT NOTE - INITIAL  Pharmacy Consult for vancomycin and cefepime Indication: L BKA wound infection, cellulitis  Allergies  Allergen Reactions  . Gardenia Jasminoides Fruit   . Kiwi Extract   . Mango Butter   . Other     Med thru epidural-unknown med Ragweed,milkweed  . Penicillins Rash    Patient Measurements: Height: 5' 10.87" (180 cm) Weight:  (inacurrate weight on bed) IBW/kg (Calculated) : 74.99  Vital Signs: Temp: 99.3 F (37.4 C) (04/07 1300) BP: 126/56 mmHg (04/07 1300) Pulse Rate: 96 (04/07 1300) Intake/Output from previous day: 04/06 0701 - 04/07 0700 In: 950 [I.V.:700; IV Piggyback:250] Out: 450 [Blood:450] Intake/Output from this shift: Total I/O In: 360 [P.O.:360] Out: 325 [Urine:325]  Labs:  Recent Labs  07/30/13 0455 07/31/13 0835 08/01/13 0655  WBC 6.5 6.4 8.8  HGB 9.9* 10.5* 8.1*  PLT 229 269 267  CREATININE 1.01 1.02 1.26    Assessment: 63 y/o male on vancomycin and cefepime for BKA cellulitis/wound infection.  Today is POD #1 of left transtibial amputation revision.  Wound culture showing MSSA.  Goal of Therapy:  Vancomycin trough 10-15 mg/L  Plan:  - Continue Vancomycin  750 mg IV q12h and cefepime 1g IV q8h - Monitor renal function, clinical progress, and culture data - Consider narrowing therapy - F/u transition to oral abx  Agapito GamesAlison Mechele Kittleson, PharmD, BCPS Clinical Pharmacist Pager: [phone] 08/01/2013 2:29 PM

## 2013-08-01 NOTE — Progress Notes (Signed)
Internal Medicine Attending  Date: 08/01/2013  Patient name: Trinidad CuretJemi Jasperson Medical record number: [id number] Date of birth: 1951-04-13 Age: 63 y.o. Gender: male  I saw and evaluated the patient on A.M rounds, and I discussed his care with housestaff.  I reviewed the resident's note by Dr. Claudell Kyleater and I agree with the resident's findings and plans as documented in her note.

## 2013-08-01 NOTE — Progress Notes (Signed)
Patient ID: Ryan CuretJemi Whetstine, male   DOB: 1951-03-08, 63 y.o.   MRN: [id number] Postoperative day 1 status post revision left transtibial amputation. Patient did have ischemic muscle as well as deep abscess. Bone muscle and soft tissue were resected. Patient has severe calcified vessels were compromised microcirculation. Recommend continue IV antibiotics for 3 days and discharged to home on oral antibiotics. Patient complains of phantom pain and orders were written for Neurontin. Discussed with the patient the importance of a vegetable base diet to try to improve his health status.

## 2013-08-01 NOTE — Progress Notes (Signed)
Inpatient Diabetes Program Recommendations  AACE/ADA: New Consensus Statement on Inpatient Glycemic Control (2013)  Target Ranges:  Prepandial:   less than 140 mg/dL      Peak postprandial:   less than 180 mg/dL (1-2 hours)      Critically ill patients:  140 - 180 mg/dL   In view of infection, pt would benefit from glucose control:  Inpatient Diabetes Program Recommendations Insulin - Basal: Please continue to increase lantus until fastings are controlled < 150 mg/dL Correction (SSI): Increase to resistant if lantus increase is not effective.  Thank you, Ryan CoffinAnn Dianne Whelchel, RN, CNS, Diabetes Coordinator [phone])

## 2013-08-01 NOTE — Progress Notes (Signed)
Subjective: Patient seen and examined at the bedside this morning. He feels OK this morning. Doesn't want to talk a lot, nods and gives thumbs up to most of my questions. Denies significant pain in his stump. Denies chest pain, SOB, abdominal pain.  Objective: Vital signs in last 24 hours: Filed Vitals:   07/31/13 1930 07/31/13 1945 07/31/13 2124 08/01/13 0548  BP: 126/77 175/95 164/85 130/70  Pulse: 96 100 95 100  Temp:   98.6 F (37 C) 98.5 F (36.9 C)  TempSrc:      Resp: 11 12 18 18   Height:      Weight:      SpO2: 100% 97% 99% 100%   Weight change:   Intake/Output Summary (Last 24 hours) at 08/01/13 1013 Last data filed at 08/01/13 0831  Gross per 24 hour  Intake   1310 ml  Output    450 ml  Net    860 ml   Physical Exam:  GENERAL- Sitting up in bed. NAD.  HEENT- NCAT, PERRL, EOMI CARDIAC- RRR RESP- Respirations equal ABDOMEN- Soft, nontender, no palpable masses or organomegaly, bowel sounds present.  NEURO- CN 2-12 grossly intact, non focal EXTREMITIES- Moves all 4 ext. LLE BKA, well wrapped with Kerlex. No drainage on the dressing. Ortho has already examined the stump this am. PSYCH- Normal mood and affect.  Lab Results: Basic Metabolic Panel:  Recent Labs Lab 07/31/13 0835 08/01/13 0655  NA 137 134*  K 4.9 5.0  CL 98 96  CO2 25 Ryan  GLUCOSE 211* 256*  BUN 20 27*  CREATININE 1.02 1.26  CALCIUM 9.3 9.2   Liver Function Tests:  Recent Labs Lab 07/27/13 1721 07/28/13 0413  AST 16 10  ALT 15 12  ALKPHOS 71 61  BILITOT 0.6 0.3  PROT 7.1 6.2  ALBUMIN 3.0* 2.7*   CBC:  Recent Labs Lab 07/27/13 1721  07/31/13 0835 08/01/13 0655  WBC 11.6*  < > 6.4 8.8  NEUTROABS 8.1*  --   --   --   HGB 10.7*  < > 10.5* 8.1*  HCT 29.9*  < > 30.5* Ryan.8*  MCV 84.7  < > 84.5 84.1  PLT 254  < > 269 267  < > = values in this interval not displayed. Cardiac Enzymes:  Recent Labs Lab 07/27/13 1721 07/28/13 0805  TROPONINI <0.30 <0.30   CBG:  Recent  Labs Lab 07/31/13 1118 07/31/13 1624 07/31/13 1820 07/31/13 2059 07/31/13 2219 08/01/13 0639  GLUCAP 198* 185* 159* 227* 207* 270*   Urinalysis:  Recent Labs Lab 07/27/13 2019  COLORURINE YELLOW  LABSPEC 1.017  PHURINE 6.5  GLUCOSEU 100*  HGBUR NEGATIVE  BILIRUBINUR NEGATIVE  KETONESUR NEGATIVE  PROTEINUR 100*  UROBILINOGEN 0.2  NITRITE NEGATIVE  LEUKOCYTESUR NEGATIVE    Micro Results: Recent Results (from the past 240 hour(s))  WOUND CULTURE     Status: None   Collection Time    07/27/13  5:26 PM      Result Value Ref Range Status   Specimen Description WOUND LEFT LEG   Final   Special Requests NONE   Final   Gram Stain     Final   Value: RARE WBC PRESENT, PREDOMINANTLY PMN     NO SQUAMOUS EPITHELIAL CELLS SEEN     RARE GRAM POSITIVE COCCI     IN PAIRS     Performed at [facility]   Culture     Final   Value: ABUNDANT STAPHYLOCOCCUS AUREUS  Note: RIFAMPIN AND GENTAMICIN SHOULD NOT BE USED AS SINGLE DRUGS FOR TREATMENT OF STAPH INFECTIONS.     Performed at [facility]   Report Status 08/01/2013 FINAL   Final   Organism ID, Bacteria STAPHYLOCOCCUS AUREUS   Final  CULTURE, BLOOD (ROUTINE X 2)     Status: None   Collection Time    07/27/13  6:25 PM      Result Value Ref Range Status   Specimen Description BLOOD ARM RIGHT   Final   Special Requests BOTTLES DRAWN AEROBIC AND ANAEROBIC 10CC   Final   Culture  Setup Time     Final   Value: 07/28/2013 00:24     Performed at [facility]   Culture     Final   Value:        BLOOD CULTURE RECEIVED NO GROWTH TO DATE CULTURE WILL BE HELD FOR 5 DAYS BEFORE ISSUING A FINAL NEGATIVE REPORT     Performed at [facility]   Report Status PENDING   Incomplete  CULTURE, BLOOD (ROUTINE X 2)     Status: None   Collection Time    07/27/13  6:30 PM      Result Value Ref Range Status   Specimen Description BLOOD HAND RIGHT   Final   Special Requests BOTTLES DRAWN AEROBIC ONLY 1CC   Final    Culture  Setup Time     Final   Value: 07/28/2013 00:24     Performed at [facility]   Culture     Final   Value:        BLOOD CULTURE RECEIVED NO GROWTH TO DATE CULTURE WILL BE HELD FOR 5 DAYS BEFORE ISSUING A FINAL NEGATIVE REPORT     Performed at [facility]   Report Status PENDING   Incomplete  URINE CULTURE     Status: None   Collection Time    07/27/13  8:21 PM      Result Value Ref Range Status   Specimen Description URINE, CLEAN CATCH   Final   Special Requests NONE   Final   Culture  Setup Time     Final   Value: 07/28/2013 03:26     Performed at [facility] Count     Final   Value: NO GROWTH     Performed at [facility]   Culture     Final   Value: NO GROWTH     Performed at [facility]   Report Status 07/29/2013 FINAL   Final  SURGICAL PCR SCREEN     Status: None   Collection Time    07/31/13  3:32 PM      Result Value Ref Range Status   MRSA, PCR NEGATIVE  NEGATIVE Final   Staphylococcus aureus NEGATIVE  NEGATIVE Final   Comment:            The Xpert SA Assay (FDA     approved for NASAL specimens     in patients over 10 years of age),     is one component of     a comprehensive surveillance     program.  Test performance has     been validated by [employer] for patients greater     than or equal to 40 year old.     It is not intended     to diagnose infection nor to  guide or monitor treatment.   Studies/Results: No results found. Medications: I have reviewed the patient's current medications. Scheduled Meds: . ceFEPime (MAXIPIME) IV  1 g Intravenous Q8H  . gabapentin  300 mg Oral TID  . heparin  5,000 Units Subcutaneous 3 times per day  . insulin aspart  0-15 Units Subcutaneous TID WC  . insulin aspart  0-5 Units Subcutaneous QHS  . insulin glargine  15 Units Subcutaneous QHS  . isosorbide mononitrate  30 mg Oral Daily  . methocarbamol  500 mg Oral 3 times per day  . metoprolol succinate   25 mg Oral Daily  . simvastatin  40 mg Oral QHS  . vancomycin  750 mg Intravenous Q12H   Continuous Infusions: . sodium chloride 20 mL/hr (07/31/13 2212)   PRN Meds:.HYDROmorphone (DILAUDID) injection, metoCLOPramide (REGLAN) injection, metoCLOPramide, morphine injection, ondansetron (ZOFRAN) IV, ondansetron, oxyCODONE-acetaminophen  Assessment/Plan: 63 year old Owens, with past medical history of diabetes, CAD, hypertension, and left BKA in 11/2012, presents with fevers, chills, which started the morning of admission during his orthopedic's office visit. He had revision of his left BKA outpatient site on 07/21/2013.   #Sepsis 2/2 abscess of left BKA stump, s/p revision left transtibial amputation: Patient is POD#1 s/p left transtibial amputation revision. In the OR he was noted to have ischemic muscle as well as deep tissue abscess, so bone, muscle, and soft tissue were resected. AVSS overnight. Afebrile. No leukocytosis this morning. Wound culture 4/2 ended up growing abundant staph aureus. Blood and urine cultures are NGTD. - Appreciate ortho recommendations - Continuing IV vancomycin and cefepime for coverage of MRSA, gram negatives and Pseudomonas (antibiotic day 6) for 3 more days then plan to discharge home on oral antibiotics per ortho - Keep left leg elevated  - Pain control (ortho also added neurotin TID for phantom pain) - Vegetarian diet - PT/OT - BMP, CBC in am  #Type 2 diabetes: Last A1c on 07/22/2013 was 6.8%. - SSI moderate - CBGs AC &HS - Lantus at 20 units qhs  Glucose-Capillary  Date Value Ref Range Status  08/01/2013 270* 70 - 99 mg/dL Final  0/07/5407 811* 70 - 99 mg/dL Final  12/26/4780 956* 70 - 99 mg/dL Final  05/28/3084 578* 70 - 99 mg/dL Final  08/01/9627 528* 70 - 99 mg/dL Final    #HTN: Pt on metoprolol and Imdur at home, which were held on admission 2/2 hypotension in the setting of sepsis. BP stable after restarting home meds. - Continuing Imdur 30mg  daily  and metoprolol succinate 25 mg BID  #Acute normocytic anemia: Stable. Likely dilutional from IV fluid resuscitation. - Continue to monitor  Recent Labs Lab 07/28/13 0413 07/29/13 0510 07/30/13 0455 07/31/13 0835 08/01/13 0655  HGB 8.8* 8.9* 9.9* 10.5* 8.1*    #CAD: Stable. Troponins were negative x2. EKG unremarkable. - Continue home Imdur 30 mg daily, metoprolol 25 mg twice a day - Continue simvastatin 40 mg daily  #DVT PPX - Heparin subq  Dispo: Disposition is deferred at this time, awaiting improvement of current medical problems.  Anticipated discharge in approximately 1-3 day(s).   The patient does not have a current PCP (No Pcp Per Patient) and does not need an [facility] hospital follow-up appointment after discharge.  The patient does not have transportation limitations that hinder transportation to clinic appointments.  .Services Needed at time of discharge: Y = Yes, Blank = No PT: Supervision - Intermittent;Home health PT  OT: OT screened, no needs identified, will sign off.  RN:  Equipment:   Other:     LOS: 5 days   Vivi BarrackSarah Tobie Perdue, MD 08/01/2013, 10:13 AM  Vivi BarrackSarah Aryella Besecker, MD  [email or web address] Pager # [phone] Office # [phone]

## 2013-08-01 NOTE — Clinical Documentation Improvement (Signed)
 "#  Acute normocytic anemia - This is likely dilutional from IV fluid resuscitation."  Dr. Huntley Dec'Sara Cater progress note 07/28/13   Possible Clinical Conditions:   - Precipitous drop in Hematocrit 2/2 ............................................    - Other Condition   - Cannot Clinically Determine     Thank You, Jerral Ralphathy R Rilei Kravitz ,RN BSN CCDS Certified Clinical Documentation Specialist:  [phone] [facility] Health Information Management

## 2013-08-01 NOTE — Evaluation (Signed)
Physical Therapy Evaluation and Discharge Patient Details Name: Ryan Owens MRN: [id number] DOB: 06/30/1950 Today's Date: 08/01/2013   History of Present Illness  Pt is 63 y.o male s/p stump revision following sepsis with cellulitis.  Clinical Impression  Patient evaluated by Physical Therapy with no further acute PT needs identified. All education has been completed and the patient has no further questions. Pt unwilling to walk with PT due to worry that he will damage his R foot, as he was told by an MD in [place] that he should not walk until he has a special diabetic type shoe to prevent break-down. Pt educated extensively on importance of performing exercises, positioning and ROM including standing and ambulating at home with HHPT when he receives his shoe and is willing to ambulate. See below for any follow-up Physial Therapy or equipment needs. PT is signing off. Thank you for this referral.     Follow Up Recommendations Home health PT;Supervision - Intermittent    Equipment Recommendations  Rolling walker with 5" wheels;3in1 (PT)    Recommendations for Other Services OT consult     Precautions / Restrictions Precautions Precautions: Fall Restrictions Weight Bearing Restrictions: Yes LLE Weight Bearing: Non weight bearing      Mobility  Bed Mobility Overal bed mobility: Needs Assistance Bed Mobility: Supine to Sit     Supine to sit: Supervision     General bed mobility comments: Supervision for safety, cues for technique. no physical assist needed  Transfers Overall transfer level: Needs assistance Equipment used: Rolling walker (2 wheeled) Transfers: Sit to/from [facility] Sit to Stand: Min guard;+2 safety/equipment Stand pivot transfers: Min guard;+2 safety/equipment       General transfer comment: sit<>stand performed with min guard for safety. Cues for hand placement (both hands to push up from bed). Pt anxious about standing due to history  of traumatic fall while in [place]. Pt does very well with maintaining balance within RW during pivot transfer to chair. Cues to lift RLE for small step.   Ambulation/Gait                Stairs            Wheelchair Mobility    Modified Rankin (Stroke Patients Only)       Balance Overall balance assessment: Needs assistance Sitting-balance support: No upper extremity supported Sitting balance-Leahy Scale: Fair Sitting balance - Comments: Sits EOB without assist   Standing balance support: Bilateral upper extremity supported Standing balance-Leahy Scale: Poor Standing balance comment: Rquires RW for UE support                             Pertinent Vitals/Pain 8/10 pain Nurse in room during evaluation and is aware Pt repositioned for comfort in chair    Home Living Family/patient expects to be discharged to:: Private residence Living Arrangements: Other relatives ([place]) Available Help at Discharge: Family;Available 24 hours/day Type of Home: House Home Access: Ramped entrance     Home Layout: One level Home Equipment: Wheelchair - manual      Prior Function Level of Independence: Independent with assistive device(s) (Wheelchair)               Hand Dominance   Dominant Hand: Right    Extremity/Trunk Assessment   Upper Extremity Assessment: Overall WFL for tasks assessed           Lower Extremity Assessment: LLE deficits/detail   LLE Deficits /  Details: Full knee extension; limited knee flexion and hip extension     Communication   Communication: No difficulties  Cognition Arousal/Alertness: Awake/alert Behavior During Therapy: WFL for tasks assessed/performed Overall Cognitive Status: Within Functional Limits for tasks assessed                      General Comments General comments (skin integrity, edema, etc.): Pt with poor sensation on RLE. He states his MD in [place] told him not to ambulate on his R  foot until he is fitted for a diabetic type shoe due to risk of ulcer formation. He is therefore unwilling to ambulate with PT at this time but states he plans to do so upon d/c with home health. Pt educated on prone positioning and standing exercises to maintain strength and ROM.    Exercises Amputee Exercises Quad Sets: AROM;Left;10 reps;Seated Hip Extension: AROM;Left;10 reps;Standing Hip Flexion/Marching: AROM;Left;10 reps;Standing Knee Flexion: AROM;Left;10 reps;Seated      Assessment/Plan    PT Assessment All further PT needs can be met in the next venue of care  PT Diagnosis Difficulty walking;Acute pain   PT Problem List Decreased range of motion;Decreased activity tolerance;Decreased balance;Decreased mobility;Pain  PT Treatment Interventions     PT Goals (Current goals can be found in the Care Plan section) Acute Rehab PT Goals Patient Stated Goal: Go to [place] in May PT Goal Formulation: No goals set, d/c therapy    Frequency     Barriers to discharge        Co-evaluation               End of Session Equipment Utilized During Treatment: Gait belt Activity Tolerance: Patient tolerated treatment well Patient left: in chair;with call bell/phone within reach Nurse Communication: Mobility status         Time: 1275-1700 PT Time Calculation (min): 32 min   Charges:   PT Evaluation $Initial PT Evaluation Tier I: 1 Procedure PT Treatments $Self Care/Home Management: 8-22   PT G Codes:        Elayne Snare, [place]   Ellouise Newer 08/01/2013, 12:47 PM

## 2013-08-01 NOTE — Progress Notes (Signed)
OT Cancellation Note  Patient Details Name: Trinidad CuretJemi Neubecker MRN: [id number] DOB: 02/18/1951   Cancelled Treatment:    Reason Eval/Treat Not Completed: OT screened, no needs identified, will sign off. OT made visit to discuss ADLs with pt. Pt reports that he still has no concerns following BKA revision. No further acute OT needs.   Rae LipsMiller, Navah Grondin M [phone] 08/01/2013, 9:24 AM

## 2013-08-02 ENCOUNTER — Encounter (HOSPITAL_COMMUNITY): Payer: Self-pay | Admitting: Orthopedic Surgery

## 2013-08-02 LAB — GLUCOSE, CAPILLARY
Glucose-Capillary: 229 mg/dL — ABNORMAL HIGH (ref 70–99)
Glucose-Capillary: 169 mg/dL — ABNORMAL HIGH (ref 70–99)
Glucose-Capillary: 354 mg/dL — ABNORMAL HIGH (ref 70–99)
Glucose-Capillary: 198 mg/dL — ABNORMAL HIGH (ref 70–99)
Glucose-Capillary: 345 mg/dL — ABNORMAL HIGH (ref 70–99)

## 2013-08-02 LAB — BASIC METABOLIC PANEL
BUN: 16 mg/dL (ref 6–23)
CO2: 23 meq/L (ref 19–32)
Calcium: 9 mg/dL (ref 8.4–10.5)
Chloride: 100 mEq/L (ref 96–112)
Creatinine, Ser: 1.15 mg/dL (ref 0.50–1.35)
GFR calc Af Amer: 76 mL/min — ABNORMAL LOW (ref >90)
GFR calc non Af Amer: 66 mL/min — ABNORMAL LOW (ref >90)
GLUCOSE: 168 mg/dL — AB (ref 70–99)
POTASSIUM: 4.6 meq/L (ref 3.7–5.3)
SODIUM: 136 meq/L — AB (ref 137–147)

## 2013-08-02 LAB — CBC
HCT: 20.6 % — ABNORMAL LOW (ref 39.0–52.0)
HCT: 20.3 % — ABNORMAL LOW (ref 39.0–52.0)
HCT: 22.7 % — ABNORMAL LOW (ref 39.0–52.0)
HEMOGLOBIN: 7.3 g/dL — AB (ref 13.0–17.0)
HEMOGLOBIN: 7.9 g/dL — AB (ref 13.0–17.0)
Hemoglobin: 7.1 g/dL — ABNORMAL LOW (ref 13.0–17.0)
MCH: 30.2 pg (ref 26.0–34.0)
MCH: 29.8 pg (ref 26.0–34.0)
MCH: 29.9 pg (ref 26.0–34.0)
MCHC: 35.4 g/dL (ref 30.0–36.0)
MCHC: 35 g/dL (ref 30.0–36.0)
MCHC: 34.8 g/dL (ref 30.0–36.0)
MCV: 85.1 fL (ref 78.0–100.0)
MCV: 85.3 fL (ref 78.0–100.0)
MCV: 86 fL (ref 78.0–100.0)
PLATELETS: 222 10*3/uL (ref 150–400)
Platelets: 228 10*3/uL (ref 150–400)
Platelets: 264 10*3/uL (ref 150–400)
RBC: 2.42 MIL/uL — AB (ref 4.22–5.81)
RBC: 2.38 MIL/uL — ABNORMAL LOW (ref 4.22–5.81)
RBC: 2.64 MIL/uL — ABNORMAL LOW (ref 4.22–5.81)
RDW: 14.2 % (ref 11.5–15.5)
RDW: 14.2 % (ref 11.5–15.5)
RDW: 13.9 % (ref 11.5–15.5)
WBC: 8.1 10*3/uL (ref 4.0–10.5)
WBC: 8.2 10*3/uL (ref 4.0–10.5)
WBC: 11.2 10*3/uL — ABNORMAL HIGH (ref 4.0–10.5)

## 2013-08-02 LAB — PREPARE RBC (CROSSMATCH)

## 2013-08-02 LAB — ABO/RH: ABO/RH(D): A NEG

## 2013-08-02 MED ORDER — INSULIN GLARGINE 100 UNIT/ML ~~LOC~~ SOLN
20.0000 [IU] | Freq: Every day | SUBCUTANEOUS | Status: DC
  Administered 2013-08-02: 20 [IU] via SUBCUTANEOUS
  Filled 2013-08-02 (×2): qty 0.2

## 2013-08-02 MED ORDER — CEPHALEXIN 500 MG PO CAPS
500.0000 mg | ORAL_CAPSULE | Freq: Three times a day (TID) | ORAL | Status: DC
  Administered 2013-08-02 – 2013-08-03 (×4): 500 mg via ORAL
  Filled 2013-08-02 (×6): qty 1

## 2013-08-02 NOTE — Progress Notes (Signed)
Internal Medicine Attending  Date: 08/02/2013  Patient name: Ryan CuretJemi Dawes Medical record number: [id number] Date of birth: 08-23-1950 Age: 63 y.o. Gender: male  I saw and evaluated the patient, and discussed his care on A.M rounds with housestaff.  I reviewed the resident's note by Dr. Claudell Kyleater and I agree with the resident's findings and plans as documented in her note.

## 2013-08-02 NOTE — Progress Notes (Signed)
Spoke with ID pharmacy. Patient's wound culture is growing MSSA. He is POD#2 s/p left transtibial amputation revision, on broad spectrum antibiotics with IV vanc and cefepime.   Pharmacy recommends narrowing to monotherapy with Keflex 500mg  TID for a total 10 day course. Renal function is good. He has a rash allergy to Penicillin but has tolerated Cefepime.  Vivi BarrackSarah Taylon Louison, MD  [email or web address] Pager # [phone] Office # [phone]

## 2013-08-02 NOTE — Progress Notes (Signed)
Patient ID: Ryan CuretJemi Owens, male   DOB: 03-19-1951, 63 y.o.   MRN: [id number] Patient with acute postoperative blood loss anemia. Patient states Neurontin helps with his phantom pain. Anticipate discharge to home when safe with therapy.

## 2013-08-02 NOTE — Progress Notes (Signed)
Spoke with the patient about his repeat hemoglobin which was 7.1 (down from 7.3). He is amenable to a blood transfusion at this time. I have ordered 1U PRBCs. Type and screen is in process. We will follow up a post-transfusion CBC.   CBC    Component Value Date/Time   WBC 8.2 08/02/2013 1204   RBC 2.38* 08/02/2013 1204   HGB 7.1* 08/02/2013 1204   HCT 20.3* 08/02/2013 1204   PLT 222 08/02/2013 1204   MCV 85.3 08/02/2013 1204   MCH 29.8 08/02/2013 1204   MCHC 35.0 08/02/2013 1204   RDW 14.2 08/02/2013 1204   LYMPHSABS 2.2 07/27/2013 1721   MONOABS 1.0 07/27/2013 1721   EOSABS 0.2 07/27/2013 1721   BASOSABS 0.1 07/27/2013 1721    Vivi BarrackSarah Chrishana Spargur, MD  [email or web address] Pager # [phone] Office # [phone]

## 2013-08-02 NOTE — Progress Notes (Addendum)
Subjective: Patient seen and examined at the bedside this morning. He feels well this morning. He is sitting up eating breakfast. Denies significant pain in his stump. Denies chest pain, SOB, abdominal pain.  Objective: Vital signs in last 24 hours: Filed Vitals:   08/01/13 1300 08/01/13 2106 08/02/13 0658 08/02/13 0705  BP: 126/56 123/71 117/66   Pulse: 96 92 85   Temp: 99.3 F (37.4 C) 98.6 F (37 C) 98.4 F (36.9 C)   TempSrc:  Oral Oral   Resp: 16 16 16    Height:      Weight:    226 lb (102.513 kg)  SpO2: 98% 97% 98%    Weight change:   Intake/Output Summary (Last 24 hours) at 08/02/13 0808 Last data filed at 08/01/13 1654  Gross per 24 hour  Intake    960 ml  Output    325 ml  Net    635 ml   Physical Exam:  GENERAL- Sitting up in bed. NAD.  HEENT- NCAT, PERRL, EOMI CARDIAC- RRR RESP- Respirations equal ABDOMEN- Soft, nontender, no palpable masses or organomegaly, bowel sounds present.  NEURO- CN 2-12 grossly intact, non focal EXTREMITIES- Moves all 4 ext. LLE BKA, well wrapped with Kerlex. No drainage on the dressing. Ortho has already examined the stump this am. PSYCH- Normal mood and affect.  Lab Results: Basic Metabolic Panel:  Recent Labs Lab 08/01/13 0655 08/02/13 0619  NA 134* 136*  K 5.0 4.6  CL 96 100  CO2 22 23  GLUCOSE 256* 168*  BUN 27* 16  CREATININE 1.26 1.15  CALCIUM 9.2 9.0   Liver Function Tests:  Recent Labs Lab 07/27/13 1721 07/28/13 0413  AST 16 10  ALT 15 12  ALKPHOS 71 61  BILITOT 0.6 0.3  PROT 7.1 6.2  ALBUMIN 3.0* 2.7*   CBC:  Recent Labs Lab 07/27/13 1721  08/01/13 0655 08/02/13 0619  WBC 11.6*  < > 8.8 8.1  NEUTROABS 8.1*  --   --   --   HGB 10.7*  < > 8.1* 7.3*  HCT 29.9*  < > 22.8* 20.6*  MCV 84.7  < > 84.1 85.1  PLT 254  < > 267 228  < > = values in this interval not displayed. Cardiac Enzymes:  Recent Labs Lab 07/27/13 1721 07/28/13 0805  TROPONINI <0.30 <0.30   CBG:  Recent Labs Lab  07/31/13 2219 08/01/13 0639 08/01/13 1102 08/01/13 1612 08/01/13 2254 08/02/13 0657  GLUCAP 207* 270* 277* 199* 229* 169*   Urinalysis:  Recent Labs Lab 07/27/13 2019  COLORURINE YELLOW  LABSPEC 1.017  PHURINE 6.5  GLUCOSEU 100*  HGBUR NEGATIVE  BILIRUBINUR NEGATIVE  KETONESUR NEGATIVE  PROTEINUR 100*  UROBILINOGEN 0.2  NITRITE NEGATIVE  LEUKOCYTESUR NEGATIVE    Micro Results: Recent Results (from the past 240 hour(s))  WOUND CULTURE     Status: None   Collection Time    07/27/13  5:26 PM      Result Value Ref Range Status   Specimen Description WOUND LEFT LEG   Final   Special Requests NONE   Final   Gram Stain     Final   Value: RARE WBC PRESENT, PREDOMINANTLY PMN     NO SQUAMOUS EPITHELIAL CELLS SEEN     RARE GRAM POSITIVE COCCI     IN PAIRS     Performed at [facility]   Culture     Final   Value: ABUNDANT STAPHYLOCOCCUS AUREUS     Note:  RIFAMPIN AND GENTAMICIN SHOULD NOT BE USED AS SINGLE DRUGS FOR TREATMENT OF STAPH INFECTIONS.     Performed at [facility]   Report Status 08/01/2013 FINAL   Final   Organism ID, Bacteria STAPHYLOCOCCUS AUREUS   Final  CULTURE, BLOOD (ROUTINE X 2)     Status: None   Collection Time    07/27/13  6:25 PM      Result Value Ref Range Status   Specimen Description BLOOD ARM RIGHT   Final   Special Requests BOTTLES DRAWN AEROBIC AND ANAEROBIC 10CC   Final   Culture  Setup Time     Final   Value: 07/28/2013 00:24     Performed at [facility]   Culture     Final   Value:        BLOOD CULTURE RECEIVED NO GROWTH TO DATE CULTURE WILL BE HELD FOR 5 DAYS BEFORE ISSUING A FINAL NEGATIVE REPORT     Performed at [facility]   Report Status PENDING   Incomplete  CULTURE, BLOOD (ROUTINE X 2)     Status: None   Collection Time    07/27/13  6:30 PM      Result Value Ref Range Status   Specimen Description BLOOD HAND RIGHT   Final   Special Requests BOTTLES DRAWN AEROBIC ONLY 1CC   Final   Culture   Setup Time     Final   Value: 07/28/2013 00:24     Performed at [facility]   Culture     Final   Value:        BLOOD CULTURE RECEIVED NO GROWTH TO DATE CULTURE WILL BE HELD FOR 5 DAYS BEFORE ISSUING A FINAL NEGATIVE REPORT     Performed at [facility]   Report Status PENDING   Incomplete  URINE CULTURE     Status: None   Collection Time    07/27/13  8:21 PM      Result Value Ref Range Status   Specimen Description URINE, CLEAN CATCH   Final   Special Requests NONE   Final   Culture  Setup Time     Final   Value: 07/28/2013 03:26     Performed at [employer] Count     Final   Value: NO GROWTH     Performed at [facility]   Culture     Final   Value: NO GROWTH     Performed at [facility]   Report Status 07/29/2013 FINAL   Final  SURGICAL PCR SCREEN     Status: None   Collection Time    07/31/13  3:32 PM      Result Value Ref Range Status   MRSA, PCR NEGATIVE  NEGATIVE Final   Staphylococcus aureus NEGATIVE  NEGATIVE Final   Comment:            The Xpert SA Assay (FDA     approved for NASAL specimens     in patients over 42 years of age),     is one component of     a comprehensive surveillance     program.  Test performance has     been validated by [employer] for patients greater     than or equal to 11 year old.     It is not intended     to diagnose infection nor to     guide  or monitor treatment.   Studies/Results: No results found. Medications: I have reviewed the patient's current medications. Scheduled Meds: . ceFEPime (MAXIPIME) IV  1 g Intravenous Q8H  . gabapentin  300 mg Oral TID  . heparin  5,000 Units Subcutaneous 3 times per day  . insulin aspart  0-15 Units Subcutaneous TID WC  . insulin aspart  0-5 Units Subcutaneous QHS  . insulin glargine  20 Units Subcutaneous QHS  . isosorbide mononitrate  30 mg Oral Daily  . methocarbamol  500 mg Oral 3 times per day  . metoprolol succinate  25 mg  Oral Daily  . simvastatin  40 mg Oral QHS  . vancomycin  750 mg Intravenous Q12H   Continuous Infusions: . sodium chloride 20 mL/hr (07/31/13 2212)   PRN Meds:.HYDROmorphone (DILAUDID) injection, metoCLOPramide (REGLAN) injection, metoCLOPramide, morphine injection, ondansetron (ZOFRAN) IV, ondansetron, oxyCODONE-acetaminophen  Assessment/Plan: 63 year old gentleman, with past medical history of diabetes, CAD, hypertension, and left BKA in 11/2012, presents with fevers, chills, which started the morning of admission during his orthopedic's office visit. He had revision of his left BKA outpatient site on 07/21/2013.   #Sepsis 2/2 abscess of left BKA stump, s/p revision left transtibial amputation: Patient is POD#2 s/p left transtibial amputation revision. In the OR he was noted to have ischemic muscle as well as deep tissue abscess, so bone, muscle, and soft tissue were resected. AVSS overnight. Afebrile. No leukocytosis this morning. Wound culture 4/2 ended up growing abundant staph aureus, we will need to make sure we cover this when we transition his antibiotic regimen. Blood and urine cultures are NGTD. - Appreciate ortho recommendations - Lajoyce Corners is performing dressing changes - Continuing IV vancomycin and cefepime for coverage of MRSA, gram negatives and Pseudomonas (antibiotic day 7) for 2 more days then plan to discharge home on oral antibiotics per ortho, will discuss with ID pharm about most appropriate oral regimen - Keep left leg elevated  - Pain control (ortho also added neurotin TID for phantom pain) - Vegetarian diet - PT/OT - BMP, CBC in am  #Type 2 diabetes: Last A1c on 07/22/2013 was 6.8%. CBGs elevated in setting of acute illness. - Appreciate diabetes coordinator recs - SSI moderate - CBGs AC &HS - Increase Lantus to 20 units qhs (was getting 15 previously)  Glucose-Capillary  Date Value Ref Range Status  08/02/2013 169* 70 - 99 mg/dL Final  05/02/1094 045* 70 - 99 mg/dL  Final  4/0/9811 914* 70 - 99 mg/dL Final  11/02/2954 213* 70 - 99 mg/dL Final  0/11/6576 469* 70 - 99 mg/dL Final    #HTN: Pt on metoprolol and Imdur at home, which were held on admission 2/2 hypotension in the setting of sepsis. BP stable after restarting home meds. - Continuing Imdur 30mg  daily and metoprolol succinate 25 mg BID  #Acute normocytic anemia: Hemoglobin dropped from 8.1 to 7.3 overnight. Likely acute blood loss from surgery. - Type and screen (I do not see one from this admission) - Transfuse 1 unit PRBC given history of CAD - Follow up post transfusion CBC   ADDENDUM: Patient wants to watch and wait prior to blood transfusion. We will check an afternoon CBC and discuss again as needed. Will also provide literature on blood transfusion risks. He is concerned about infection risk.  Recent Labs Lab 07/29/13 0510 07/30/13 0455 07/31/13 0835 08/01/13 0655 08/02/13 0619  HGB 8.9* 9.9* 10.5* 8.1* 7.3*    #CAD: Stable. Troponins were negative x2 on admission. EKG unremarkable. - Continue  home Imdur 30 mg daily, metoprolol 25 mg twice a day - Continue simvastatin 40 mg daily  #DVT PPX - Heparin subq  Dispo: Disposition is deferred at this time, awaiting improvement of current medical problems.  Anticipated discharge in approximately 1-3 day(s).   The patient does not have a current PCP (No Pcp Per Patient) and does not need an [facility] hospital follow-up appointment after discharge.  The patient does not have transportation limitations that hinder transportation to clinic appointments.  .Services Needed at time of discharge: Y = Yes, Blank = No PT: Supervision - Intermittent;Home health PT  OT: OT screened, no needs identified, will sign off.  RN:   Equipment: Agricultural consultant with 5" wheels;3in1 (PT)   Other:     LOS: 6 days   Vivi Barrack, MD 08/02/2013, 8:08 AM  Vivi Barrack, MD  [email or web address] Pager # [phone] Office # [phone]

## 2013-08-02 NOTE — Care Management Note (Signed)
CARE MANAGEMENT NOTE 08/02/2013  Patient:  Ryan Owens,Ryan Owens   Account Number:  [id number]  Date Initiated:  08/02/2013  Documentation initiated by:  Vance PeperBRADY,Ticara Waner  Subjective/Objective Assessment:   [age] yr old male admitted with abscess of left BKA. s/p revision 07/31/13     Action/Plan:   Patient is active with [facility], no changes. Has all DME, from previous admission..   Anticipated DC Date:  08/03/2013   Anticipated DC Plan:  HOME W HOME HEALTH SERVICES      DC Planning Services  CM consult      [facility]  [facility] Of [facility]/PTA Provider  HOME HEALTH   Choice offered to / List presented to:          [facility]  [facility]      [facility]  [facility]   Status of service:  In process, will continue to follow Medicare Important Message given?

## 2013-08-03 ENCOUNTER — Inpatient Hospital Stay: Payer: Medicare Other

## 2013-08-03 LAB — TYPE AND SCREEN
ABO/RH(D): A NEG
Antibody Screen: NEGATIVE
UNIT DIVISION: 0

## 2013-08-03 LAB — GLUCOSE, CAPILLARY
GLUCOSE-CAPILLARY: 206 mg/dL — AB (ref 70–99)
Glucose-Capillary: 383 mg/dL — ABNORMAL HIGH (ref 70–99)

## 2013-08-03 LAB — CBC
HEMATOCRIT: 23.5 % — AB (ref 39.0–52.0)
HEMOGLOBIN: 8.1 g/dL — AB (ref 13.0–17.0)
MCH: 29.8 pg (ref 26.0–34.0)
MCHC: 34.5 g/dL (ref 30.0–36.0)
MCV: 86.4 fL (ref 78.0–100.0)
Platelets: 237 10*3/uL (ref 150–400)
RBC: 2.72 MIL/uL — AB (ref 4.22–5.81)
RDW: 14.3 % (ref 11.5–15.5)
WBC: 8.8 10*3/uL (ref 4.0–10.5)

## 2013-08-03 LAB — RETICULOCYTES
RBC.: 2.72 MIL/uL — ABNORMAL LOW (ref 4.22–5.81)
Retic Count, Absolute: 103.4 10*3/uL (ref 19.0–186.0)
Retic Ct Pct: 3.8 % — ABNORMAL HIGH (ref 0.4–3.1)

## 2013-08-03 LAB — BASIC METABOLIC PANEL
BUN: 16 mg/dL (ref 6–23)
CO2: 23 meq/L (ref 19–32)
CREATININE: 1.05 mg/dL (ref 0.50–1.35)
Calcium: 8.8 mg/dL (ref 8.4–10.5)
Chloride: 98 mEq/L (ref 96–112)
GFR calc Af Amer: 85 mL/min — ABNORMAL LOW (ref >90)
GFR calc non Af Amer: 74 mL/min — ABNORMAL LOW (ref >90)
GLUCOSE: 223 mg/dL — AB (ref 70–99)
Potassium: 4.7 mEq/L (ref 3.7–5.3)
Sodium: 135 mEq/L — ABNORMAL LOW (ref 137–147)

## 2013-08-03 LAB — CULTURE, BLOOD (ROUTINE X 2)
Culture: NO GROWTH
Culture: NO GROWTH

## 2013-08-03 LAB — FOLATE: Folate: 9.8 ng/mL

## 2013-08-03 LAB — IRON AND TIBC
Iron: 18 ug/dL — ABNORMAL LOW (ref 42–135)
SATURATION RATIOS: 9 % — AB (ref 20–55)
TIBC: 204 ug/dL — AB (ref 215–435)
UIBC: 186 ug/dL (ref 125–400)

## 2013-08-03 LAB — VITAMIN B12: Vitamin B-12: 254 pg/mL (ref 211–911)

## 2013-08-03 LAB — FERRITIN: Ferritin: 459 ng/mL — ABNORMAL HIGH (ref 22–322)

## 2013-08-03 MED ORDER — OXYCODONE-ACETAMINOPHEN 5-325 MG PO TABS: 1.0000 | ORAL_TABLET | ORAL | Status: DC | PRN

## 2013-08-03 MED ORDER — GABAPENTIN 300 MG PO CAPS: 300.0000 mg | ORAL_CAPSULE | Freq: Three times a day (TID) | ORAL | Status: AC

## 2013-08-03 MED ORDER — CEPHALEXIN 500 MG PO CAPS: 500.0000 mg | ORAL_CAPSULE | Freq: Three times a day (TID) | ORAL | Status: DC

## 2013-08-03 NOTE — Care Management Note (Signed)
CARE MANAGEMENT NOTE 08/03/2013  Patient:  Trinidad CuretVANZANDT,Coleton   Account Number:  [id number]  Date Initiated:  08/02/2013  Documentation initiated by:  Vance PeperBRADY,Evadean Sproule  Subjective/Objective Assessment:   63 yr old male admitted with abscess of left BKA. s/p revision 07/31/13     Action/Plan:   Patient is active with [facility], no changes. Has all DME, from previous admission..   Anticipated DC Date:  08/03/2013   Anticipated DC Plan:  HOME W HOME HEALTH SERVICES      DC Planning Services  CM consult      [facility]  [facility] Of Svcs/PTA Provider  HOME HEALTH   Choice offered to / List presented to:  C-1 Patient        HH arranged  HH-2 PT  HH-1 RN  HH-3 OT      [facility]  [facility]   Status of service:  Completed, signed off Medicare Important Message given?   (If response is "NO", the following Medicare IM given date fields will be blank) Date Medicare IM given:   Date Additional Medicare IM given:    Discharge Disposition:  HOME W HOME HEALTH SERVICES  Per UR Regulation:

## 2013-08-03 NOTE — Progress Notes (Signed)
Pt discharged to home. D/c instructions given, all questions addressed. Vitals stable.

## 2013-08-03 NOTE — Progress Notes (Signed)
Subjective: Patient seen and examined at the bedside this morning. He feels well and ready to go home. No dizziness, weakness. No pain at wound site.   Wound examined and appears clean, dry, intact.  Objective: Vital signs in last 24 hours: Filed Vitals:   08/02/13 1915 08/02/13 2148 08/03/13 0500 08/03/13 0557  BP: 117/61 106/60  108/59  Pulse: 101 97  89  Temp: 100.1 F (37.8 C) 99.8 F (37.7 C)  99.8 F (37.7 C)  TempSrc: Oral Oral  Oral  Resp: 16 16  16   Height:      Weight:   171 lb 14.4 oz (77.973 kg)   SpO2: 95% 100%  98%   Weight change:   Intake/Output Summary (Last 24 hours) at 08/03/13 1016 Last data filed at 08/03/13 0600  Gross per 24 hour  Intake 1082.5 ml  Output    720 ml  Net  362.5 ml   Physical Exam:  GENERAL- Sitting up in bed. NAD.  HEENT- NCAT, PERRL, EOMI CARDIAC- RRR RESP- Respirations equal ABDOMEN- Soft, nontender, no palpable masses or organomegaly, bowel sounds present.  NEURO- CN 2-12 grossly intact, non focal EXTREMITIES- Moves all 4 ext. LLE BKA, well wrapped with Kerlex. Stump examined. Staples in place. Incision is clean, dry, intact. Will have nurse replace dressing prior to discharge. PSYCH- Normal mood and affect.  Lab Results: Basic Metabolic Panel:  Recent Labs Lab 08/02/13 0619 08/03/13 0640  NA 136* 135*  K 4.6 4.7  CL 100 98  CO2 23 23  GLUCOSE 168* 223*  BUN 16 16  CREATININE 1.15 1.05  CALCIUM 9.0 8.8   Liver Function Tests:  Recent Labs Lab 07/27/13 1721 07/28/13 0413  AST 16 10  ALT 15 12  ALKPHOS 71 61  BILITOT 0.6 0.3  PROT 7.1 6.2  ALBUMIN 3.0* 2.7*   CBC:  Recent Labs Lab 07/27/13 1721  08/02/13 2106 08/03/13 0640  WBC 11.6*  < > 11.2* 8.8  NEUTROABS 8.1*  --   --   --   HGB 10.7*  < > 7.9* 8.1*  HCT 29.9*  < > 22.7* 23.5*  MCV 84.7  < > 86.0 86.4  PLT 254  < > 264 237  < > = values in this interval not displayed. Cardiac Enzymes:  Recent Labs Lab 07/27/13 1721 07/28/13 0805    TROPONINI <0.30 <0.30   CBG:  Recent Labs Lab 08/01/13 2254 08/02/13 0657 08/02/13 1109 08/02/13 1616 08/02/13 2130 08/03/13 0622  GLUCAP 229* 169* 354* 198* 345* 206*   Urinalysis:  Recent Labs Lab 07/27/13 2019  COLORURINE YELLOW  LABSPEC 1.017  PHURINE 6.5  GLUCOSEU 100*  HGBUR NEGATIVE  BILIRUBINUR NEGATIVE  KETONESUR NEGATIVE  PROTEINUR 100*  UROBILINOGEN 0.2  NITRITE NEGATIVE  LEUKOCYTESUR NEGATIVE    Micro Results: Recent Results (from the past 240 hour(s))  WOUND CULTURE     Status: None   Collection Time    07/27/13  5:26 PM      Result Value Ref Range Status   Specimen Description WOUND LEFT LEG   Final   Special Requests NONE   Final   Gram Stain     Final   Value: RARE WBC PRESENT, PREDOMINANTLY PMN     NO SQUAMOUS EPITHELIAL CELLS SEEN     RARE GRAM POSITIVE COCCI     IN PAIRS     Performed at [facility]   Culture     Final   Value: ABUNDANT STAPHYLOCOCCUS AUREUS  Note: RIFAMPIN AND GENTAMICIN SHOULD NOT BE USED AS SINGLE DRUGS FOR TREATMENT OF STAPH INFECTIONS.     Performed at [facility]   Report Status 08/01/2013 FINAL   Final   Organism ID, Bacteria STAPHYLOCOCCUS AUREUS   Final  CULTURE, BLOOD (ROUTINE X 2)     Status: None   Collection Time    07/27/13  6:25 PM      Result Value Ref Range Status   Specimen Description BLOOD ARM RIGHT   Final   Special Requests BOTTLES DRAWN AEROBIC AND ANAEROBIC 10CC   Final   Culture  Setup Time     Final   Value: 07/28/2013 00:24     Performed at [facility]   Culture     Final   Value: NO GROWTH 5 DAYS     Performed at [facility]   Report Status 08/03/2013 FINAL   Final  CULTURE, BLOOD (ROUTINE X 2)     Status: None   Collection Time    07/27/13  6:30 PM      Result Value Ref Range Status   Specimen Description BLOOD HAND RIGHT   Final   Special Requests BOTTLES DRAWN AEROBIC ONLY 1CC   Final   Culture  Setup Time     Final   Value: 07/28/2013  00:24     Performed at [facility]   Culture     Final   Value: NO GROWTH 5 DAYS     Performed at [facility]   Report Status 08/03/2013 FINAL   Final  URINE CULTURE     Status: None   Collection Time    07/27/13  8:21 PM      Result Value Ref Range Status   Specimen Description URINE, CLEAN CATCH   Final   Special Requests NONE   Final   Culture  Setup Time     Final   Value: 07/28/2013 03:26     [facility]formed at [facility]   [place]ony Count     Final   Value: NO GROWTH     Performed at [facility]   Culture     Final   Value: NO GROWTH     Performed at [facility]   Report Status 07/29/2013 FINAL   Final  SURGICAL PCR SCREEN     Status: None   Collection Time    07/31/13  3:32 PM      Result Value Ref Range Status   MRSA, PCR NEGATIVE  NEGATIVE Final   Staphylococcus aureus NEGATIVE  NEGATIVE Final   Comment:            The Xpert SA Assay (FDA     approved for NASAL specimens     in patients over 63 years of age),     is one component of     a comprehensive surveillance     program.  Test performance has     been validated by [facility] for patients greater     than or equal to 63 year old.     It is not intended     to diagnose infection nor to     guide or monitor treatment.   Studies/Results: No results found. Medications: I have reviewed the patient's current medications. Scheduled Meds: . cephALEXin  500 mg Oral 3 times per day  . gabapentin  300 mg Oral TID  . heparin  5,000 Units  Subcutaneous 3 times per day  . insulin aspart  0-15 Units Subcutaneous TID WC  . insulin aspart  0-5 Units Subcutaneous QHS  . insulin glargine  20 Units Subcutaneous QHS  . isosorbide mononitrate  30 mg Oral Daily  . methocarbamol  500 mg Oral 3 times per day  . metoprolol succinate  25 mg Oral Daily  . simvastatin  40 mg Oral QHS   Continuous Infusions: . sodium chloride 20 mL/hr (07/31/13 2212)   PRN Meds:.HYDROmorphone  (DILAUDID) injection, metoCLOPramide (REGLAN) injection, metoCLOPramide, morphine injection, ondansetron (ZOFRAN) IV, ondansetron, oxyCODONE-acetaminophen  Assessment/Plan: 63 year old gentleman, with past medical history of diabetes, CAD, hypertension, and left BKA in 11/2012, presents with fevers, chills, which started the morning of admission during his orthopedic's office visit. He had revision of his left BKA outpatient site on 07/21/2013.   #Sepsis 2/2 abscess of left BKA stump, s/p revision left transtibial amputation: Patient is POD#3 s/p left transtibial amputation revision. In the OR he was noted to have ischemic muscle as well as deep tissue abscess, so bone, muscle, and soft tissue were resected. AVSS overnight. Afebrile. No leukocytosis this morning. Wound culture 4/2 ended up growing abundant staph aureus, MSSA. Blood and urine cultures are NGTD. Wound looka good this am. - Appreciate ortho recommendations, patient may be discharged from their standpoint - Continue Keflex 500mg  TID, will provide antibiotics for a total of 10 post-op days, today is day 3/10 - Keep left leg elevated  - Pain control (ortho also added neurotin TID for phantom pain) - Vegetarian diet - PT/OT have seen him and signed off, HH PT/OR/RN have been arranged - Medically stable for discharge, I have arranged for a 1-time f/u in our clinic while he establishes with a new PCP in [place]  #Type 2 diabetes: Last A1c on 07/22/2013 was 6.8%. CBGs elevated in setting of acute illness. - Appreciate diabetes coordinator recs - SSI moderate - CBGs AC &HS - Lantus 20 units qhs  Glucose-Capillary  Date Value Ref Range Status  08/03/2013 206* 70 - 99 mg/dL Final  05/02/1094 045* 70 - 99 mg/dL Final  4/0/9811 914* 70 - 99 mg/dL Final  11/02/2954 213* 70 - 99 mg/dL Final  0/11/6576 469* 70 - 99 mg/dL Final    #HTN: Pt on metoprolol and Imdur at home, which were held on admission 2/2 hypotension in the setting of sepsis. BP  stable after restarting home meds. - Continuing Imdur 30mg  daily and metoprolol succinate 25 mg BID  #Acute normocytic anemia: Likely acute blood loss from surgery. Trend below. He is s/p 1U PRBCs on 4/8. Stable this morning. - Continue to monitor as outpatient - Follow up anemia panel  Recent Labs Lab 08/01/13 0655 08/02/13 0619 08/02/13 1204 08/02/13 2106 08/03/13 0640  HGB 8.1* 7.3* 7.1* 7.9* 8.1*    #CAD: Stable. Troponins were negative x2 on admission. EKG unremarkable. - Continue home Imdur 30 mg daily, metoprolol 25 mg twice a day - Continue simvastatin 40 mg daily  #DVT PPX - Heparin subq  Dispo: Anticipated discharge in approximately 0-1 day(s).   The patient does not have a current PCP (No Pcp Per Patient) and does not need an [facility] hospital follow-up appointment after discharge.  The patient does not have transportation limitations that hinder transportation to clinic appointments.  .Services Needed at time of discharge: Y = Yes, Blank = No PT: Supervision - Intermittent;Home health PT  OT: He already has HHOT from last appointment, will continue.  RN: [facility] for  dressing changes  Equipment: Rolling walker with 5" wheels;3in1 (PT)   Other:     LOS: 7 days   Vivi Barrack, MD 08/03/2013, 10:16 AM  Vivi Barrack, MD  [email or web address] Pager # [phone] Office # [phone]

## 2013-08-03 NOTE — Progress Notes (Signed)
Inpatient Diabetes Program Recommendations  AACE/ADA: New Consensus Statement on Inpatient Glycemic Control (2013)  Target Ranges:  Prepandial:   less than 140 mg/dL      Peak postprandial:   less than 180 mg/dL (1-2 hours)      Critically ill patients:  140 - 180 mg/dL   Pt to be discharged today. Orders written.  Glucose levels are still high in 300's. Pt states that MD told him that the antibiotics would not work until the blood sugars are controlled. Pt to go home on same home dose prior to adm: lantus 10 units and correction tidwc. Pt states his blood sugars have been well controlled until this incident with the spider bite and plant poison exposure. [facility] Eloise HarmanSilom, but his sister had to cancel since he is still here this am. [facility]. Pt has appt to see Dr Lajoyce Cornersuda tomorrow, as pt stated that is what he was told. I am trying to get his appt rescheduled at this time. (RN was hesitant to try.) I am unable to reach anyone,  Thus I left a message to call pt's sister with the number they have on file. Thank you, Lenor CoffinAnn Mandolin Falwell, RN, CNS, Diabetes Coordinator [phone])

## 2013-08-03 NOTE — Discharge Instructions (Signed)
It was a pleasure taking care of you. - Please take your antibiotic, Keflex, three times a day for another 7 days. This is to treat your wound infection. - Please take gabapentin for nerve pain/phantom pain three times a day. This medicine may cause some sedation. - We have prescribed you a few more pain pills for your post-operative pain. - Follow up has been arranged with Dr. Lajoyce Cornersuda. We have also arranged for you to come to our internal medicine clinic at [facility] for a 1-time hospital follow up visit. We need to check your blood levels closely. Please establish with a primary care doctor [place] as soon as possible.  - Home health PT, OT, and nursing have been arranged for dressing changes. - If you develop fever, worsening pain, pus-like drainage from the wound, dizziness, weakness, please call Dr. Audrie Liauda's clinic or return to the ED.

## 2013-08-03 NOTE — Discharge Summary (Signed)
Name: Ryan Owens MRN: [id number] DOB: [birth year] 63 y.o. PCP: No Pcp Per Patient  Date of Admission: 07/27/2013  5:05 PM Date of Discharge: 08/03/2013 Attending Physician: Axel Filler, MD  Discharge Diagnosis: Principal Problem:   Sepsis 2/2 abscess of left BKA stump, s/p revision left transtibial amputation Active Problems:   Diabetes   CAD (coronary artery disease)   Hypertension   S/P BKA, Left  Discharge Medications:   Medication List         BLOOD GLUCOSE METER DISPOSABLE Devi  1 strip by Does not apply route 4 (four) times daily as needed (Accu check strips).     Blood Glucose Meter kit  Use as instructed     cephALEXin 500 MG capsule  Commonly known as:  KEFLEX  Take 1 capsule (500 mg total) by mouth every 8 (eight) hours.     gabapentin 300 MG capsule  Commonly known as:  NEURONTIN  Take 1 capsule (300 mg total) by mouth 3 (three) times daily.     insulin aspart 100 UNIT/ML FlexPen  Commonly known as:  NOVOLOG FLEXPEN  Inject 0-15 Units into the skin 3 (three) times daily with meals.     Insulin Glargine 100 UNIT/ML Solostar Pen  Commonly known as:  LANTUS SOLOSTAR  Inject 10 Units into the skin daily at 10 pm.     ISOSORBIDE MONONITRATE PO  Take 25 mg by mouth daily. Foreign formulation (pt from [place]) - Mono Cedocard brand name (isosorbide mononitrate)     metFORMIN 500 MG tablet  Commonly known as:  GLUCOPHAGE  Take 500 mg by mouth 2 (two) times daily with a meal.     methocarbamol 500 MG tablet  Commonly known as:  ROBAXIN  Take 1 tablet (500 mg total) by mouth 3 (three) times daily.     METOPROLOL SUCCINATE ER PO  Take 23.75 mg by mouth 2 (two) times daily. Foreign formulation (pt from [place]) - patient taking metoprolol succinate 23.20m BID     oxyCODONE-acetaminophen 5-325 MG per tablet  Commonly known as:  ROXICET  Take 1 tablet by mouth every 4 (four) hours as needed for severe pain.     PRESCRIPTION MEDICATION  Take  30 mg by mouth daily. Foreign diabetic medication (pt from [place] - Diamicron MR 336OQ(gliclazide) - sulfonylurea class     promethazine 25 MG tablet  Commonly known as:  PHENERGAN  Take 25 mg by mouth daily.     simvastatin 40 MG tablet  Commonly known as:  ZOCOR  Take 40 mg by mouth at bedtime.        Disposition and follow-up:   [facility]  At the hospital follow up visit please address:  1.  CBG control. Surgical site appearance (this is his second stump revision 2/2 infection).  2.  Labs / imaging needed at time of follow-up: CBC, BMP  3.  Pending labs/ test needing follow-up: Wound and blood cultures (NGTD), anemia panel  Follow-up Appointments: Follow-up Information   Follow up with DNewt Minion MD On 08/17/2013. (_0 :30pm. This is your orthopedic surgeon.)    Specialty:  Orthopedic Surgery   Contact information:   [place] [phone]      Follow up with MCresenciano Genre MD On 08/08/2013. (_1 :45am. This is a 1-time hospital follow up visit in our internal medicine primary care clinic.)    Specialty:  Internal Medicine  Contact information:   [address] [place] [address] [phone]       Schedule an appointment as soon as possible for a visit with [facility]    . (Please call to establish care as soon as possible. In the meantime we have arranged for you to come to our clinic for a 1-time hospital follow up visit.)    Contact information:   [place] [address] [phone]      Discharge Instructions: Discharge Orders   Future Appointments Provider Department Dept Phone   08/03/2013 12:15 PM [facility] Covering Provider [place] [phone]   08/08/2013 9:45 AM Cresenciano Genre, MD [place] [phone]   Future Orders  Complete By Expires   Call MD for:  persistant dizziness or light-headedness  As directed    Call MD for:  redness, tenderness, or signs of infection (pain, swelling, redness, odor or green/yellow discharge around incision site)  As directed    Call MD for:  temperature >100.4  As directed    Diet - low sodium heart healthy  As directed    Increase activity slowly  As directed       Consultations:  Ortho Sharol Given)  Procedures Performed:  Dg Chest 2 View  07/21/2013   CLINICAL DATA:  Preop for leg amputation.  EXAM: CHEST  2 VIEW  COMPARISON:  None.  FINDINGS: The heart size and mediastinal contours are within normal limits. Both lungs are clear. The visualized skeletal structures are unremarkable.  IMPRESSION: No active cardiopulmonary disease.   Electronically Signed   By: Markus Daft M.D.   On: 07/21/2013 14:15   Dg Chest Port 1 View  07/27/2013   CLINICAL DATA:  Chest pain  EXAM: PORTABLE CHEST - 1 VIEW  COMPARISON:  Prior chest x-ray 07/21/2013  FINDINGS: The lungs are clear and negative for focal airspace consolidation, pulmonary edema or suspicious pulmonary nodule. No pleural effusion or pneumothorax. Cardiac and mediastinal contours are within normal limits. No acute fracture or lytic or blastic osseous lesions. The visualized upper abdominal bowel gas pattern is unremarkable.  IMPRESSION: No active cardiopulmonary disease.   Electronically Signed   By: Jacqulynn Cadet M.D.   On: 07/27/2013 18:38   Dg Knee Left Port  07/27/2013   CLINICAL DATA:  Possible amputation site infection  EXAM: PORTABLE LEFT KNEE - 1-2 VIEW  COMPARISON:  None.  FINDINGS: Surgical changes of below-the-knee amputation. Cutaneous skin staples are present. Small locular of air are noted inferior to the tibial osteotomy site. There is diffuse soft tissue swelling of the stump soft tissues. No irregularity of the osseous margins to suggest osseous involvement. Mild degenerative changes in the knee joint. No knee joint  effusion. Atherosclerotic vascular calcifications are noted.  IMPRESSION: 1. Soft tissue swelling and a small amount of the subcutaneous air in the amputation stump overlying the tibial osteotomy site. Depending on how recent the amputation was, these may represent normal postoperative findings or an infectious process. 2. No evidence of osseous involvement. The osteotomy sites remain sharp and well-defined.   Electronically Signed   By: Jacqulynn Cadet M.D.   On: 07/27/2013 18:40    OPERATIVE REPORT  DATE OF SURGERY: 07/31/2013  PATIENT: Francee Gentile, 63 y.o. male  PRE-OPERATIVE DIAGNOSIS: Abscess left below knee amputation  POST-OPERATIVE DIAGNOSIS: abscess left below knee amputation  PROCEDURE: Procedure(s):  REVISION LEFT BELOW KNEE AMPUTATION left  SURGEON: Surgeon(s):  Newt Minion, MD  ANESTHESIA: general  EBL: See anesthesia notes ML  SPECIMEN: No Specimen  TOURNIQUET: * No tourniquets in log *  PROCEDURE DETAILS:  Patient is a 63 year old gentleman diabetic insensate neuropathy with purulent drainage from his left transtibial amputation. Patient has failed conservative wound care and IV antibiotics and presents at this time for revision amputation. Risks and benefits were discussed including revision amputation. Patient states he understands and wished to proceed at this time. Description of procedure patient was brought to the operating room and underwent a general anesthetic. After adequate levels of anesthesia were obtained patient's left lower extremity was prepped using DuraPrep draped into a sterile field. A fishmouth incision was made around the necrotic wound. This was carried down to the abscess and necrotic tissue extended down to the bone. The distal 2 cm of the tibia and fibular were resected. This was irrigated with normal saline. Hemostasis was obtained. There was good healthy tissue at the base of the wound. The incision was closed using 2-0 nylon. The wound was covered  with Adaptic orthopedic sponges AB dressing Kerlix and Coban. Patient was extubated taken to the PACU in stable condition.  PLAN OF CARE: Admit to inpatient  PATIENT DISPOSITION: PACU - hemodynamically stable.  Newt Minion, MD  07/31/2013  6:06 PM   Admission HPI:  63 year old male with PMH of HTN and DM, CAD with stent placement, presented to the Ed today, transferred from his orthopedic doctors office today where he presented for routine follow up but with complaints of pain in his stump, increased swelling at the stump site, with dark yellow drainage from the stump site which started the night prior . Pt had a BKA done last dec in [place] after his leg got infected after being bitten by a spider. The stump was healing well, when he said got droped accidentally during physical therapy by the therapist and landed on his stump, in [place]. Pt got back into the country about a week ago and went to see an orthopedist, who recommended revision of his stump, which was done 07/21/2013. He was at his Clinic today when together with the pain and increased swelling at his stump he started feeling very dizzy and light headed, with blurred vision and it was noticed that he was febrile. His blood pressure was checked and his systolic Bp was in the 56E. Pt said samples for wound cultures where taken in the clinic, he also developed some mild left sided chest pains, non radiating which was no longer present,, he had an Ekg done over there and then he was taken to [facility].  Physical Exam:  Blood pressure 100/48, pulse 85, temperature 98.7 F (37.1 C), temperature source Oral, resp. rate 16, height 5' 10.87" (1.8 m), weight 190 lb 0.6 oz (86.2 kg), SpO2 98.00%.  GENERAL- alert, co-operative, appears as stated age, not in any distress.  HEENT- Atraumatic, normocephalic, PERRL, EOMI, oral mucosa appears moist, good and intact dentition. No carotid bruit, no cervical LN enlargement, thyroid does not appear  enlarged.  CARDIAC- RRR, not tender to palpation, no murmurs, rubs or gallops.  RESP- Moving equal volumes of air, and clear to auscultation bilaterally.  ABDOMEN- Soft, nontender, no palpable masses or organomegaly, bowel sounds present.  NEURO- No obvious Cr N abnormality, strenght 5/5 in Right lower extremity, and upper extremities.  EXTREMITIES- pulse 2+ Right lower extremity, no pedal edema. Left lower extremity- BKA, stitches present on stump site, Appears tense, with  some erythema and swelling, warm on palpation.  SKIN- Warm, dry, No rash or lesion.  PSYCH- Normal mood and affect, appropriate thought content and speech.   Hospital Course by problem list: 63 year old gentleman, with past medical history of diabetes, CAD, hypertension, and left BKA in 11/2012, presents with fevers, chills, which started the morning of admission during his orthopedic's office visit. He had revision of his left BKA outpatient site on 07/21/2013.   1. Sepsis 2/2 abscess of left BKA stump, s/p revision left transtibial amputation - Patient presented with fever, tachycardia, leukocytosis, hypotension. Had revision of the left BKA stump on 07/21/2013 by Dr. Sharol Given and was noted to have erythema and purulent discharge at his follow up clinic appointment, from which he was sent directly to the hospital. Left knee x-ray shwowed soft tissue swelling and subcutaneous air most likely representing ongoing cellulitis. Lactic acid level was elevated at 2.29. Sepsis resolved with IVF resuscitation and IV antibiotics with vancomycin and cefepime (for coverage of MRSA, gram negatives, and Pseudomonas). Wound did not improve with conservative therapy, so he was taken to the OR by Dr. Sharol Given on 4/6 for stump revision. He was noted to have ischemic muscle as well as deep tissue abscess, so bone, muscle, and soft tissue were resected. Op note as above. Wound culture 4/2 ended up growing abundant MSSA. Blood and urine cultures were NGTD.  After discussion with ID pharmacy, on POD#2 we transitioned his antibiotics to Keflex 518m TID, and will provide antibiotics for a total of 10 post-op days. We kept left leg elevated and provided pain control (ortho also added neurotin TID for phantom pain). At discharge AVSS and patient denied pain. Site was clean, dry, intact. PT saw the patient and recommended home health PT. He was discharged in stable condition to home with home health services including RN for dressing changes.  2. Type 2 diabetes - Last A1c on 07/22/2013 was 6.8%. Home meds include Lantus 10 units QHS, SSI with Novolog, Metformin 5035mBID, and a sulfonurea from the [place] Diamicron 306maily. CBGs elevated here in setting of acute illness. The diabetes coordinator saw the patient in consultation.  We provided SSI moderate, trended CBGs AC &HS, and gave Lantus 20 units QHS. Close PCP follow up was arranged. As his oral medications were started back after discharge, we resumed his home Lantus dose of 10units. Please follow up diabetes control.  3. HTN - Meds were held on admission 2/2 hypotension in the setting of sepsis. After resolution, we restarted Imdur 59m47mily and metoprolol succinate 25 mg BID with good BP control.  4. Acute normocytic anemia - Likely acute blood loss from surgery as well as dilutional from IVF resuscitation. Trend below. He is s/p 1U PRBCs on 4/8. Anemia panel was checked and can be followed up in outpatient setting. Hemoglobin stable at discharge. Please continue to monitor as outpatient.  Hemoglobin  Date Value Ref Range Status  08/03/2013 8.1* 13.0 - 17.0 g/dL Final  08/02/2013 7.9* 13.0 - 17.0 g/dL Final  08/02/2013 7.1* 13.0 - 17.0 g/dL Final  08/02/2013 7.3* 13.0 - 17.0 g/dL Final  08/01/2013 8.1* 13.0 - 17.0 g/dL Final     REPEATED TO VERIFY    5. CAD - Stable. Troponins were negative x2 on admission. EKG unremarkable. We continued home Imdur 30 mg daily, metoprolol 25 mg twice a day,  simvastatin 40 mg daily.   Discharge Vitals:   BP 108/59  Pulse 89  Temp(Src) 99.8 F (37.7 C) (  Oral)  Resp 16  Ht 5' 10.87" (1.8 m)  Wt 171 lb 14.4 oz (77.973 kg)  BMI 24.07 kg/m2  SpO2 98%  Discharge Labs:  Results for orders placed during the hospital encounter of 07/27/13 (from the past 24 hour(s))  GLUCOSE, CAPILLARY     Status: Abnormal   Collection Time    08/02/13 11:09 AM      Result Value Ref Range   Glucose-Capillary 354 (*) 70 - 99 mg/dL   Comment 1 STAT Lab     Comment 2 Notify RN    TYPE AND SCREEN     Status: None   Collection Time    08/02/13 12:04 PM      Result Value Ref Range   ABO/RH(D) A NEG     Antibody Screen NEG     Sample Expiration 08/05/2013     Unit Number [id number]     Blood Component Type RBC LR PHER2     Unit division 00     Status of Unit ISSUED     Transfusion Status OK TO TRANSFUSE     Crossmatch Result Compatible    CBC     Status: Abnormal   Collection Time    08/02/13 12:04 PM      Result Value Ref Range   WBC 8.2  4.0 - 10.5 K/uL   RBC 2.38 (*) 4.22 - 5.81 MIL/uL   Hemoglobin 7.1 (*) 13.0 - 17.0 g/dL   HCT 20.3 (*) 39.0 - 52.0 %   MCV 85.3  78.0 - 100.0 fL   MCH 29.8  26.0 - 34.0 pg   MCHC 35.0  30.0 - 36.0 g/dL   RDW 14.2  11.5 - 15.5 %   Platelets 222  150 - 400 K/uL  PREPARE RBC (CROSSMATCH)     Status: None   Collection Time    08/02/13 12:04 PM      Result Value Ref Range   Order Confirmation ORDER PROCESSED BY BLOOD BANK    ABO/RH     Status: None   Collection Time    08/02/13 12:04 PM      Result Value Ref Range   ABO/RH(D) A NEG    GLUCOSE, CAPILLARY     Status: Abnormal   Collection Time    08/02/13  4:16 PM      Result Value Ref Range   Glucose-Capillary 198 (*) 70 - 99 mg/dL   Comment 1 Notify RN    CBC     Status: Abnormal   Collection Time    08/02/13  9:06 PM      Result Value Ref Range   WBC 11.2 (*) 4.0 - 10.5 K/uL   RBC 2.64 (*) 4.22 - 5.81 MIL/uL   Hemoglobin 7.9 (*) 13.0 - 17.0 g/dL    HCT 22.7 (*) 39.0 - 52.0 %   MCV 86.0  78.0 - 100.0 fL   MCH 29.9  26.0 - 34.0 pg   MCHC 34.8  30.0 - 36.0 g/dL   RDW 13.9  11.5 - 15.5 %   Platelets 264  150 - 400 K/uL  GLUCOSE, CAPILLARY     Status: Abnormal   Collection Time    08/02/13  9:30 PM      Result Value Ref Range   Glucose-Capillary 345 (*) 70 - 99 mg/dL   Comment 1 Notify RN    GLUCOSE, CAPILLARY     Status: Abnormal   Collection Time    08/03/13  6:22 AM  Result Value Ref Range   Glucose-Capillary 206 (*) 70 - 99 mg/dL   Comment 1 Notify RN    CBC     Status: Abnormal   Collection Time    08/03/13  6:40 AM      Result Value Ref Range   WBC 8.8  4.0 - 10.5 K/uL   RBC 2.72 (*) 4.22 - 5.81 MIL/uL   Hemoglobin 8.1 (*) 13.0 - 17.0 g/dL   HCT 23.5 (*) 39.0 - 52.0 %   MCV 86.4  78.0 - 100.0 fL   MCH 29.8  26.0 - 34.0 pg   MCHC 34.5  30.0 - 36.0 g/dL   RDW 14.3  11.5 - 15.5 %   Platelets 237  150 - 400 K/uL  RETICULOCYTES     Status: Abnormal   Collection Time    08/03/13  6:40 AM      Result Value Ref Range   Retic Ct Pct 3.8 (*) 0.4 - 3.1 %   RBC. 2.72 (*) 4.22 - 5.81 MIL/uL   Retic Count, Manual 103.4  19.0 - 186.0 K/uL  BASIC METABOLIC PANEL     Status: Abnormal   Collection Time    08/03/13  6:40 AM      Result Value Ref Range   Sodium 135 (*) 137 - 147 mEq/L   Potassium 4.7  3.7 - 5.3 mEq/L   Chloride 98  96 - 112 mEq/L   CO2 23  19 - 32 mEq/L   Glucose, Bld 223 (*) 70 - 99 mg/dL   BUN 16  6 - 23 mg/dL   Creatinine, Ser 1.05  0.50 - 1.35 mg/dL   Calcium 8.8  8.4 - 10.5 mg/dL   GFR calc non Af Amer 74 (*) >90 mL/min   GFR calc Af Amer 85 (*) >90 mL/min    Signed: Lesly Dukes, MD 08/03/2013, 10:18 AM   Time Spent on Discharge: 40 minutes  Services Needed at time of discharge: Y = Yes, Blank = No  PT:  Supervision - Intermittent;Home health PT   OT:  He already has HHOT from last appointment, will continue.   RN:  [facility] for dressing changes   Equipment:  Rolling walker with 5" wheels;3in1  (PT)   Other:

## 2013-08-03 NOTE — Progress Notes (Signed)
Patient ID: Ryan CuretJemi Owens, male   DOB: [birth year], 63 y.o.   MRN: [id number] Patient complains of blister on the right foot. Examination of right foot shows no blisters no areas of redness. He does have a little bit a callus over the lateral border of the fifth metatarsal head. Patient will obtain diabetic shoe wear as an outpatient. Patient may be discharged to home from an orthopedic standpoint.

## 2013-08-04 ENCOUNTER — Telehealth: Payer: Self-pay | Admitting: *Deleted

## 2013-08-04 MED ORDER — SULFAMETHOXAZOLE-TMP DS 800-160 MG PO TABS: 1.0000 | ORAL_TABLET | Freq: Two times a day (BID) | ORAL | Status: AC

## 2013-08-04 NOTE — Telephone Encounter (Signed)
[facility] - pt did not fill Cephalexin rx; requesting another ABX. I called and talked to pt. Stated he's allergic to PCN. I will send message to Dr Claudell Kyleater.

## 2013-08-04 NOTE — Telephone Encounter (Signed)
  Reason for call:   I placed an outgoing call to Mr. Trinidad CuretJemi Hentz at 10:30  AM regarding antibiotic issues.   Assessment/ Plan:   This is a 63 year old male with PMH of HTN and DM, CAD with stent placement, discharged yesterday after an admission for sepsis 2/2 abscess of left BKA stump, s/p revision left transtibial amputation. Patient was discharged on Keflex 500mg  TID for treatment of MSSA, which was cultured from his wound.  Patient tells me he refuses to take this medicine because he has an allergy to it. He says the allergy is tongue swelling and rash. I explained that he was given this medicine in the hospital and was observed with no issues. He still does not want to take it.  Spoke with ID pharmacy, we will switch the patient to Bactrim 1DS BID for 7 days.  [facility] [place] on [address]. Patient notified that the prescription will be ready soon.  As always, pt is advised that if symptoms worsen or new symptoms arise, they should go to an urgent care facility or to to ER for further evaluation.   Vivi BarrackSarah Cari Burgo, MD   08/04/2013, 10:31 AM

## 2013-08-08 ENCOUNTER — Ambulatory Visit: Payer: Medicare Other | Admitting: Internal Medicine

## 2013-11-01 ENCOUNTER — Ambulatory Visit: Payer: Medicare Other | Attending: Orthopedic Surgery | Admitting: Physical Therapy

## 2013-11-01 DIAGNOSIS — Z4789 Encounter for other orthopedic aftercare: Secondary | ICD-10-CM | POA: Diagnosis present

## 2013-11-01 DIAGNOSIS — T879 Unspecified complications of amputation stump: Secondary | ICD-10-CM | POA: Insufficient documentation

## 2013-11-01 DIAGNOSIS — S88119A Complete traumatic amputation at level between knee and ankle, unspecified lower leg, initial encounter: Secondary | ICD-10-CM | POA: Diagnosis not present

## 2013-11-01 DIAGNOSIS — T8131XA Disruption of external operation (surgical) wound, not elsewhere classified, initial encounter: Secondary | ICD-10-CM | POA: Diagnosis not present

## 2013-11-02 ENCOUNTER — Ambulatory Visit: Payer: Medicare Other | Admitting: Physical Therapy

## 2013-11-06 ENCOUNTER — Encounter: Payer: Medicare Other | Admitting: Physical Therapy

## 2013-11-07 ENCOUNTER — Ambulatory Visit: Payer: Medicare Other | Admitting: Physical Therapy

## 2013-11-07 DIAGNOSIS — Z4789 Encounter for other orthopedic aftercare: Secondary | ICD-10-CM | POA: Diagnosis not present

## 2013-11-08 ENCOUNTER — Encounter: Payer: Medicare Other | Admitting: Physical Therapy

## 2013-11-09 ENCOUNTER — Encounter: Payer: Medicare Other | Admitting: Physical Therapy

## 2013-11-10 ENCOUNTER — Encounter: Payer: Medicare Other | Admitting: Physical Therapy

## 2013-11-13 ENCOUNTER — Encounter: Payer: Medicare Other | Admitting: Physical Therapy

## 2013-11-14 ENCOUNTER — Ambulatory Visit: Payer: Medicare Other | Admitting: Physical Therapy

## 2013-11-14 DIAGNOSIS — Z4789 Encounter for other orthopedic aftercare: Secondary | ICD-10-CM | POA: Diagnosis not present

## 2013-11-15 ENCOUNTER — Encounter: Payer: Medicare Other | Admitting: Physical Therapy

## 2013-11-16 ENCOUNTER — Encounter: Payer: Medicare Other | Admitting: Physical Therapy

## 2013-11-17 ENCOUNTER — Encounter: Payer: Medicare Other | Admitting: Physical Therapy

## 2014-09-04 IMAGING — CR DG CHEST 2V
2 series · 2 of 2 positions shown · non-contrast
Comparison: None.

CLINICAL DATA: Preop for leg amputation.

EXAM:
CHEST  2 VIEW

[w chest lat]
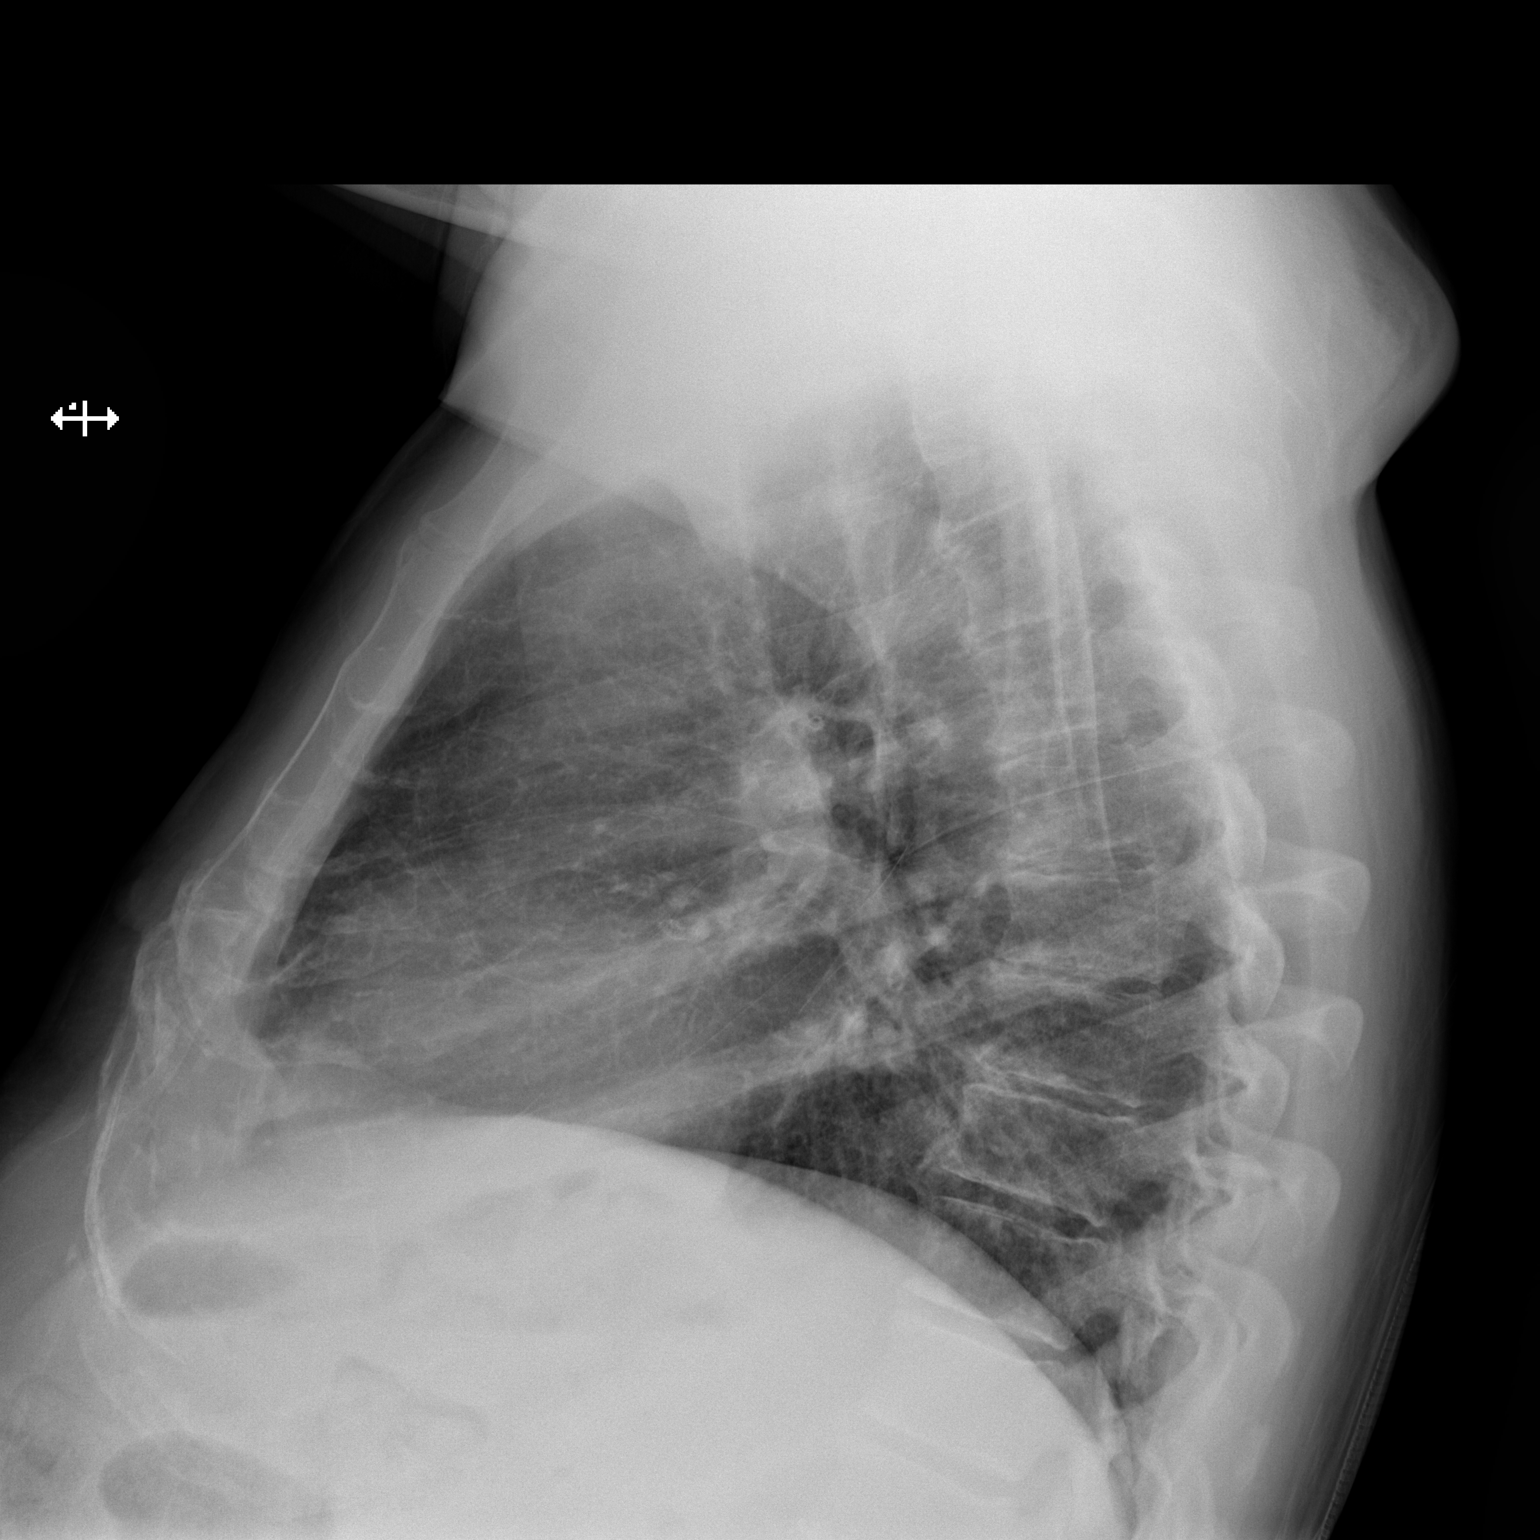

[x chest ap]
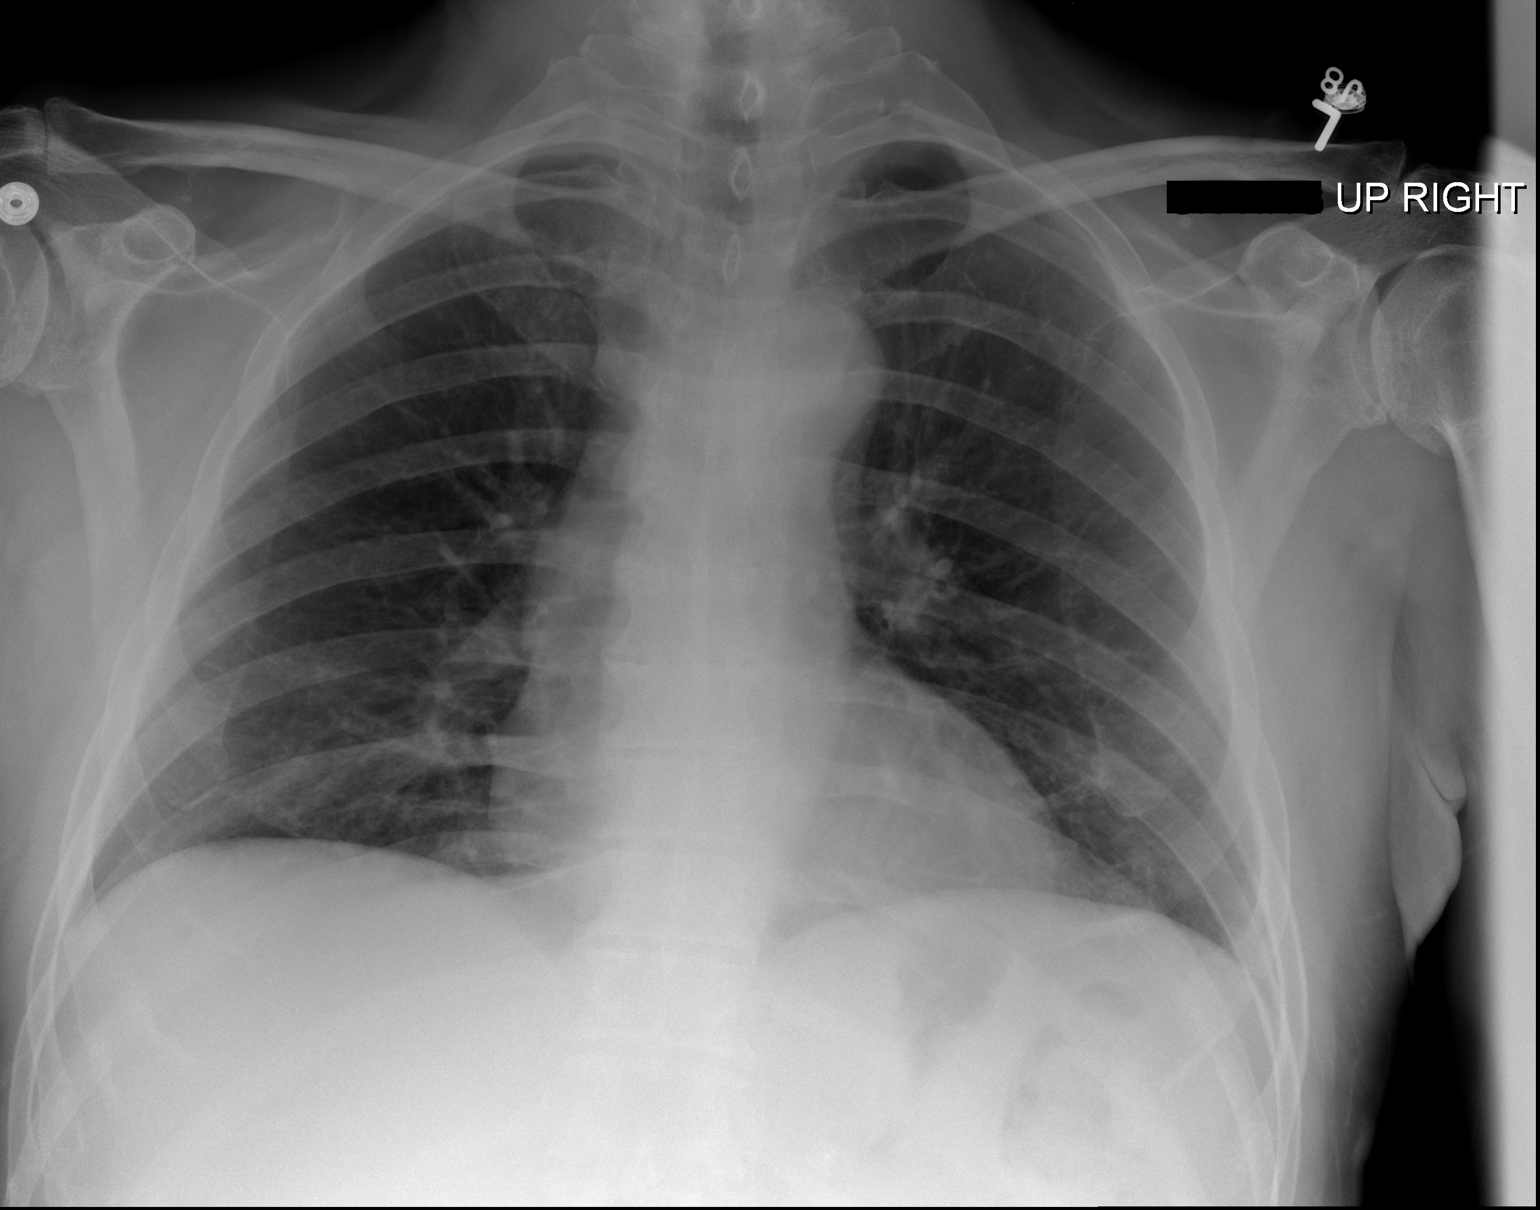

[2 of 2 positions shown; findings below may reference images not displayed]

FINDINGS: The heart size and mediastinal contours are within normal limits.
Both lungs are clear. The visualized skeletal structures are
unremarkable.
IMPRESSION: No active cardiopulmonary disease.

## 2016-09-29 ENCOUNTER — Encounter (INDEPENDENT_AMBULATORY_CARE_PROVIDER_SITE_OTHER): Payer: Self-pay | Admitting: Orthopedic Surgery

## 2016-09-29 ENCOUNTER — Ambulatory Visit (INDEPENDENT_AMBULATORY_CARE_PROVIDER_SITE_OTHER): Payer: Medicare Other | Admitting: Orthopedic Surgery

## 2016-09-29 VITALS — Ht 70.0 in | Wt 170.0 lb

## 2016-09-29 DIAGNOSIS — Z89512 Acquired absence of left leg below knee: Secondary | ICD-10-CM | POA: Diagnosis not present

## 2016-09-29 DIAGNOSIS — L97221 Non-pressure chronic ulcer of left calf limited to breakdown of skin: Secondary | ICD-10-CM | POA: Diagnosis not present

## 2016-09-29 MED ORDER — OXYCODONE-ACETAMINOPHEN 5-325 MG PO TABS: 1.0000 | ORAL_TABLET | Freq: Three times a day (TID) | ORAL | 0 refills | Status: AC | PRN

## 2016-09-29 NOTE — Progress Notes (Signed)
Office Visit Note   Patient: Ryan Owens           Date of Birth: Mar 03, 1951           MRN: [id number] Visit Date: 09/29/2016              Requested by: No referring provider defined for this encounter. PCP: Patient, No Pcp Per  Chief Complaint  Patient presents with  . Left Leg - Pain    Hx left BKA revision 07/31/13      HPI: Patient presents with an in bearing ulcer left transtibial amputation. 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  Assessment & Plan: Visit Diagnoses:  1. Acquired absence of left leg below knee (HCC)   2. Non-pressure chronic ulcer of left calf, limited to breakdown of skin (HCC)     Plan: Patient is given a prescription for [facility] 4 either a new socket or a window to allow for decreased pressure over the distal tibia he will need Her studies are worn out and torn due to the increased pressure distally.  Follow-Up Instructions: Return if symptoms worsen or fail to improve.   Ortho Exam  Patient is alert, oriented, no adenopathy, well-dressed, normal affect, normal respiratory effort. Examination patient has an antalgic gait. Examination he has a pre-ulcerative area over the inferior pole the patella from an bearing on the residual limb he has an ulcer over the distal tibia and there is no redness no cellulitis no drainage there is some mild maceration the ulcer is 5 x 10 mm and 1 mm deep. There is no odor no cellulitis or drainage.  Imaging: No results found.  Labs: Lab Results  Component Value Date   HGBA1C 6.8 (H) 07/22/2013   REPTSTATUS 07/29/2013 FINAL 07/27/2013   GRAMSTAIN  07/27/2013    RARE WBC PRESENT, PREDOMINANTLY PMN NO SQUAMOUS EPITHELIAL CELLS SEEN RARE GRAM POSITIVE COCCI IN PAIRS Performed at [facility]   CULT NO GROWTH Performed at [facility] 07/27/2013   LABORGA STAPHYLOCOCCUS  AUREUS 07/27/2013    Orders:  No orders of the defined types were placed in this encounter.  Meds ordered this encounter  Medications  . oxyCODONE-acetaminophen (ROXICET) 5-325 MG tablet    Sig: Take 1 tablet by mouth every 8 (eight) hours as needed for severe pain.    Dispense:  20 tablet    Refill:  0     Procedures: No procedures performed  Clinical Data: No additional findings.  ROS:  All other systems negative, except as noted in the HPI. Review of Systems  Objective: Vital Signs: Ht 5\' 10"  (1.778 m)   Wt 170 lb (77.1 kg)   BMI 24.39 kg/m   Specialty Comments:  No specialty comments available.  PMFS History: Patient Active Problem List   Diagnosis Date Noted  . Non-pressure chronic ulcer of left calf, limited to breakdown of skin (HCC) 09/29/2016  . Acquired absence of left leg below knee (HCC) 09/29/2016  . Diabetes (HCC) 07/27/2013  . Sepsis with cellulitis 07/27/2013  . CAD (coronary artery disease) 07/27/2013  . Hypertension 07/27/2013  . S/P BKA, Left 07/27/2013  . BKA stump complication (HCC) 07/21/2013  . Dehiscence of closure of skin 07/14/2013   Past Medical History:  Diagnosis Date  . Complication of anesthesia    spinal and  epidural "cant take"  allergic, reports having knot and was told not to use again  . Coronary artery disease   . Diabetes mellitus without complication (HCC)   . History of blood transfusion   . Hyperlipemia   . Hypertension   . Neuropathy     No family history on file.  Past Surgical History:  Procedure Laterality Date  . AMPUTATION Left 2014   toe, foot,   . AMPUTATION Left 07/21/2013   Procedure: AMPUTATION BELOW KNEE;  Surgeon: Nadara Mustard, MD;  [facility]: [facility];  Service: Orthopedics;  Laterality: Left;  Revision Left Below Knee Amputation  . ANGIOPLASTY  1994  . APPENDECTOMY  1984  . BELOW KNEE LEG AMPUTATION  04/20/13  . CORONARY ANGIOPLASTY WITH STENT PLACEMENT    . STUMP REVISION Left 07/31/2013    Procedure: REVISION LEFT BELOW KNEE AMPUTATION;  Surgeon: Nadara Mustard, MD;  Location: [facility] OR;  Service: Orthopedics;  Laterality: Left;  . WISDOM TOOTH EXTRACTION     Social History   Occupational History  . Not on file.   Social History Main Topics  . Smoking status: Former Games developer  . Smokeless tobacco: Never Used     Comment: QUIT SMOKING IN THE 70'S     . Alcohol use No  . Drug use: No  . Sexual activity: Not on file     Comment: smoked approx 1 year around age 66

## 2017-05-13 ENCOUNTER — Telehealth (INDEPENDENT_AMBULATORY_CARE_PROVIDER_SITE_OTHER): Payer: Self-pay | Admitting: Orthopedic Surgery

## 2017-05-13 NOTE — Telephone Encounter (Signed)
Patient went to [facility] [place] and they would like Dr. Lajoyce Cornersuda to fax a prescription for 2 leg liners and a liner for the bucket.  Phone # [phone]   Fax # [phone]

## 2017-05-14 NOTE — Telephone Encounter (Signed)
Faxed to [phone]

## 2017-07-23 ENCOUNTER — Telehealth (INDEPENDENT_AMBULATORY_CARE_PROVIDER_SITE_OTHER): Payer: Self-pay | Admitting: Orthopedic Surgery

## 2017-07-23 NOTE — Telephone Encounter (Signed)
Patient was seen at [facility] in [place] and he is asking we write a prescription for a new socket since it is too loose and is rubbing a sore. For the benefit of the insurance company he wants to include that they have tried new liners and new socks but they have not helped. Fax for [facility] # [phone]

## 2017-07-26 NOTE — Telephone Encounter (Signed)
Please call and make appt. Pt has not been in office in almost a year and insurance will require office notes for new supplies. Thanks

## 2017-08-03 ENCOUNTER — Ambulatory Visit (INDEPENDENT_AMBULATORY_CARE_PROVIDER_SITE_OTHER): Payer: Medicare Other | Admitting: Orthopedic Surgery

## 2017-08-03 ENCOUNTER — Encounter (INDEPENDENT_AMBULATORY_CARE_PROVIDER_SITE_OTHER): Payer: Self-pay | Admitting: Orthopedic Surgery

## 2017-08-03 DIAGNOSIS — Z89512 Acquired absence of left leg below knee: Secondary | ICD-10-CM | POA: Diagnosis not present

## 2017-08-03 NOTE — Progress Notes (Signed)
Office Visit Note   Patient: Ryan Owens           Date of Birth: [birth year]           MRN: [id number] Visit Date: 08/03/2017              Requested by: No referring provider defined for this encounter. PCP: Patient, No Pcp Per  Chief Complaint  Patient presents with  . Left Knee - Follow-up      HPI: Patient is a 67 year old gentleman who is status post left transtibial amputation revised in April 2015 approximately 4 years ago.  Patient is currently N bearing on the residual limb has ulceration over the tibia he also has dermatitis from the silicone globally around the knee and the residual limb.  Patient is wearing 2 silicone liners a soft sleeve and a sock and still has a loose fitting prosthesis.  Assessment & Plan: Visit Diagnoses:  1. Acquired absence of left leg below knee [facility](HCC)     Plan: Patient was given a prescription for a new liner new socket new materials and supplies.  Patient was also given a prescription to wear the Vive stump shrinker directly against the skin to facilitate resolution of the dermatitis.  Follow-Up Instructions: Return if symptoms worsen or fail to improve.   Ortho Exam  Patient is alert, oriented, no adenopathy, well-dressed, normal affect, normal respiratory effort. Examination patient has an antalgic gait his prosthesis is loose is no rotational support he has no varus and valgus support despite the extra layers of stocking soft socket and sleeve.  Patient has dermatitis around the thigh and knee and the residual limb.  There is bony prominence with skin breakdown over the end of the tibia secondary to soft tissue loss and endbearing on the prosthesis.  Imaging: No results found. No images are attached to the encounter.  Labs: Lab Results  Component Value Date   HGBA1C 6.8 (H) 07/22/2013   REPTSTATUS 07/29/2013 FINAL 07/27/2013   GRAMSTAIN  07/27/2013    RARE WBC PRESENT, PREDOMINANTLY PMN NO SQUAMOUS EPITHELIAL CELLS SEEN RARE  GRAM POSITIVE COCCI IN PAIRS Performed at [facility]   CULT NO GROWTH Performed at [facility] 07/27/2013   LABORGA STAPHYLOCOCCUS AUREUS 07/27/2013    @LABSALLVALUES (HGBA1)@  There is no height or weight on file to calculate BMI.  Orders:  No orders of the defined types were placed in this encounter.  No orders of the defined types were placed in this encounter.    Procedures: No procedures performed  Clinical Data: No additional findings.  ROS:  All other systems negative, except as noted in the HPI. Review of Systems  Objective: Vital Signs: There were no vitals taken for this visit.  Specialty Comments:  No specialty comments available.  PMFS History: Patient Active Problem List   Diagnosis Date Noted  . Non-pressure chronic ulcer of left calf, limited to breakdown of skin (HCC) 09/29/2016  . Acquired absence of left leg below knee (HCC) 09/29/2016  . Diabetes (HCC) 07/27/2013  . Sepsis with cellulitis 07/27/2013  . CAD (coronary artery disease) 07/27/2013  . Hypertension 07/27/2013  . S/P BKA, Left 07/27/2013  . BKA stump complication (HCC) 07/21/2013  . Dehiscence of closure of skin 07/14/2013   Past Medical History:  Diagnosis Date  . Complication of anesthesia    spinal and  epidural "cant take"  allergic, reports having knot and was told not to use again  . Coronary artery disease   .  Diabetes mellitus without complication (HCC)   . History of blood transfusion   . Hyperlipemia   . Hypertension   . Neuropathy     History reviewed. No pertinent family history.  Past Surgical History:  Procedure Laterality Date  . AMPUTATION Left 2014   toe, foot,   . AMPUTATION Left 07/21/2013   Procedure: AMPUTATION BELOW KNEE;  Surgeon: Nadara Mustard, MD;  Location: [facility] OR;  Service: Orthopedics;  Laterality: Left;  Revision Left Below Knee Amputation  . ANGIOPLASTY  1994  . APPENDECTOMY  1984  . BELOW KNEE LEG AMPUTATION  04/20/13  .  CORONARY ANGIOPLASTY WITH STENT PLACEMENT    . STUMP REVISION Left 07/31/2013   Procedure: REVISION LEFT BELOW KNEE AMPUTATION;  Surgeon: Nadara Mustard, MD;  Location: [facility] OR;  Service: Orthopedics;  Laterality: Left;  . WISDOM TOOTH EXTRACTION     Social History   Occupational History  . Not on file  Tobacco Use  . Smoking status: Former Games developer  . Smokeless tobacco: Never Used  . Tobacco comment: QUIT SMOKING IN THE 70'S     Substance and Sexual Activity  . Alcohol use: No  . Drug use: No  . Sexual activity: Not on file    Comment: smoked approx 1 year around age 44

## 2017-08-25 ENCOUNTER — Telehealth (INDEPENDENT_AMBULATORY_CARE_PROVIDER_SITE_OTHER): Payer: Self-pay | Admitting: Orthopedic Surgery

## 2017-08-25 NOTE — Telephone Encounter (Signed)
Last two OV notes faxed to [facility] [phone]

## 2018-07-13 ENCOUNTER — Ambulatory Visit (INDEPENDENT_AMBULATORY_CARE_PROVIDER_SITE_OTHER): Payer: Medicare Other | Admitting: Orthopedic Surgery

## 2018-07-28 ENCOUNTER — Telehealth (INDEPENDENT_AMBULATORY_CARE_PROVIDER_SITE_OTHER): Payer: Self-pay | Admitting: Radiology

## 2018-07-28 NOTE — Telephone Encounter (Signed)
Called and left voicemail asking patient to call us back to answer pre screening questions for appointment on 4/6 

## 2018-08-01 ENCOUNTER — Ambulatory Visit (INDEPENDENT_AMBULATORY_CARE_PROVIDER_SITE_OTHER): Payer: Medicare HMO | Admitting: Physician Assistant

## 2018-08-01 ENCOUNTER — Encounter (INDEPENDENT_AMBULATORY_CARE_PROVIDER_SITE_OTHER): Payer: Self-pay | Admitting: Orthopedic Surgery

## 2018-08-01 ENCOUNTER — Other Ambulatory Visit: Payer: Self-pay

## 2018-08-01 VITALS — Ht 70.0 in | Wt 170.0 lb

## 2018-08-01 DIAGNOSIS — Z89512 Acquired absence of left leg below knee: Secondary | ICD-10-CM

## 2018-08-01 NOTE — Progress Notes (Signed)
Office Visit Note   Patient: Ryan Owens           Date of Birth: [birth year]           MRN: [id number] Visit Date: 08/01/2018              Requested by: No referring provider defined for this encounter. PCP: Patient, No Pcp Per  Chief Complaint  Patient presents with  . Left Leg - Follow-up    HX BKA uses [facility]       HPI: The patient is a 68 year old gentleman who is status post a left transtibial amputation which was revised in April 2015.  He comes back in for reevaluation of his prosthesis.  He notes a significant decrease in the volume of the residual limb and reports that his fit has been progressively worsening over the past year.  He reports his silicone liners are frequently wearing out.  He is also been having difficulty with sweating and rashes over his residual limb and wonders if there is anything further we can do to improve this.  We did discuss that use of a Vive stump shrinker stocking under his liner may improve the sweating and rashes and will include this in his prosthetic recommendation orders.  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  Assessment & Plan: Visit Diagnoses:  1. Acquired absence of left leg below knee (HCC)     Plan: The patient was given a prescription for [facility] to reevaluate his prosthetic for replacement of his socket and silicone liners, supplies and materials as well as to supply a Vive stump shrinker stocking to help with some of his rash issues.  We discussed that he will need to pulled this back down in order to have enough silicone over the thigh to create a good fit.  He will follow-up here in several months should he have further concerns.  Follow-Up Instructions: Return in about 3 months (around 10/31/2018), or if symptoms worsen or fail to improve.   Ortho Exam  Patient is alert, oriented, no adenopathy, well-dressed, normal affect, normal respiratory effort. The left transtibial  amputation site is well consolidated and without signs of breakdown.  He does have some rash over the distal thigh as well as the upper thigh consistent with heat type rash from swelling.  But there is no breakdown.  He has full knee extension and good flexion.  Imaging: No results found. No images are attached to the encounter.  Labs: Lab Results  Component Value Date   HGBA1C 6.8 (H) 07/22/2013   REPTSTATUS 07/29/2013 FINAL 07/27/2013   GRAMSTAIN  07/27/2013    RARE WBC PRESENT, PREDOMINANTLY PMN NO SQUAMOUS EPITHELIAL CELLS SEEN RARE GRAM POSITIVE COCCI IN PAIRS Performed at [facility]   CULT NO GROWTH Performed at [facility] 07/27/2013   LABORGA STAPHYLOCOCCUS AUREUS 07/27/2013     Lab Results  Component Value Date   ALBUMIN 2.7 (L) 07/28/2013   ALBUMIN 3.0 (L) 07/27/2013   ALBUMIN 3.7 07/21/2013    Body mass index is 24.39 kg/m.  Orders:  No orders of the defined types were placed in this encounter.  No orders of the defined types were placed in this encounter.    Procedures: No procedures performed  Clinical Data: No additional findings.  ROS:  All other systems negative, except as noted in the HPI. Review of Systems  Objective: Vital Signs: Ht 5\' 10"  (1.778 m)  Wt 170 lb (77.1 kg)   BMI 24.39 kg/m   Specialty Comments:  No specialty comments available.  PMFS History: Patient Active Problem List   Diagnosis Date Noted  . Non-pressure chronic ulcer of left calf, limited to breakdown of skin (HCC) 09/29/2016  . Acquired absence of left leg below knee (HCC) 09/29/2016  . Diabetes (HCC) 07/27/2013  . Sepsis with cellulitis 07/27/2013  . CAD (coronary artery disease) 07/27/2013  . Hypertension 07/27/2013  . S/P BKA, Left 07/27/2013  . BKA stump complication (HCC) 07/21/2013  . Dehiscence of closure of skin 07/14/2013   Past Medical History:  Diagnosis Date  . Complication of anesthesia    spinal and  epidural "cant take"   allergic, reports having knot and was told not to use again  . Coronary artery disease   . Diabetes mellitus without complication (HCC)   . History of blood transfusion   . Hyperlipemia   . Hypertension   . Neuropathy     History reviewed. No pertinent family history.  Past Surgical History:  Procedure Laterality Date  . AMPUTATION Left 2014   toe, foot,   . AMPUTATION Left 07/21/2013   Procedure: AMPUTATION BELOW KNEE;  Surgeon: Ryan MustardMarcus V Duda, MD;  Location: [facility] OR;  Service: Orthopedics;  Laterality: Left;  Revision Left Below Knee Amputation  . ANGIOPLASTY  1994  . APPENDECTOMY  1984  . BELOW KNEE LEG AMPUTATION  04/20/13  . CORONARY ANGIOPLASTY WITH STENT PLACEMENT    . STUMP REVISION Left 07/31/2013   Procedure: REVISION LEFT BELOW KNEE AMPUTATION;  Surgeon: Ryan MustardMarcus V Duda, MD;  Location: [facility] OR;  Service: Orthopedics;  Laterality: Left;  . WISDOM TOOTH EXTRACTION     Social History   Occupational History  . Not on file  Tobacco Use  . Smoking status: Former Games developermoker  . Smokeless tobacco: Never Used  . Tobacco comment: QUIT SMOKING IN THE 70'S     Substance and Sexual Activity  . Alcohol use: No  . Drug use: No  . Sexual activity: Not on file    Comment: smoked approx 1 year around age 68

## 2018-08-03 ENCOUNTER — Encounter (INDEPENDENT_AMBULATORY_CARE_PROVIDER_SITE_OTHER): Payer: Self-pay | Admitting: Physician Assistant

## 2019-03-13 ENCOUNTER — Telehealth: Payer: Self-pay | Admitting: Orthopedic Surgery

## 2019-03-13 NOTE — Telephone Encounter (Signed)
Patient called requesting that our office call the [facility] in [place] to order his supplies for his prosthetic leg.  CB#[phone].  Thank you.

## 2019-03-13 NOTE — Telephone Encounter (Signed)
Rx was filled and patient would like it to be faxed to [phone] to ATTN:[facility]

## 2019-12-20 ENCOUNTER — Telehealth: Payer: Self-pay | Admitting: Orthopedic Surgery

## 2019-12-20 NOTE — Telephone Encounter (Signed)
Patient called requesting a new prostatic leg need to be re-measured and new leg ordered. Please call patient about this matter at .[phone].

## 2019-12-20 NOTE — Telephone Encounter (Signed)
Can youplease call pt and make an appt has been over a year since last office visit.

## 2019-12-25 ENCOUNTER — Encounter: Payer: Self-pay | Admitting: Orthopedic Surgery

## 2019-12-25 ENCOUNTER — Ambulatory Visit (INDEPENDENT_AMBULATORY_CARE_PROVIDER_SITE_OTHER): Payer: Medicare HMO | Admitting: Orthopedic Surgery

## 2019-12-25 DIAGNOSIS — Z89512 Acquired absence of left leg below knee: Secondary | ICD-10-CM

## 2019-12-26 ENCOUNTER — Encounter: Payer: Self-pay | Admitting: Orthopedic Surgery

## 2019-12-26 NOTE — Progress Notes (Signed)
Office Visit Note   Patient: Ryan Owens           Date of Birth: January 21, 1951           MRN: [id number] Visit Date: 12/25/2019              Requested by: No referring provider defined for this encounter. PCP: Patient, No Pcp Per  Chief Complaint  Patient presents with  . Left Leg - Pain      HPI: Patient is a 69 year old gentleman who has been having loss of residual volume and his transtibial amputation.  Patient states that the prosthesis is loose he has fallen 2 times due to the instability of the socket fitting he states he has no rotational control he states that the liner is torn and air gets in the liner causing further instability.   Assessment & Plan: Visit Diagnoses:  1. Acquired absence of left leg below knee [facility])     Plan: Patient is given a prescription for [facility] for new socket new liner new materials new supplies.  Follow-Up Instructions: Return if symptoms worsen or fail to improve.   Ortho Exam  Patient is alert, oriented, no adenopathy, well-dressed, normal affect, normal respiratory effort. Examination patient has an antalgic gait the liner is delaminated patient has no rotational stability with the prosthesis the leg is loose.  There is no ulcers or calluses on the residual limb.  Imaging: No results found. No images are attached to the encounter.  Labs: Lab Results  Component Value Date   HGBA1C 6.8 (H) 07/22/2013   REPTSTATUS 07/29/2013 FINAL 07/27/2013   GRAMSTAIN  07/27/2013    RARE WBC PRESENT, PREDOMINANTLY PMN NO SQUAMOUS EPITHELIAL CELLS SEEN RARE GRAM POSITIVE COCCI IN PAIRS Performed at [facility]   CULT NO GROWTH Performed at [facility] 07/27/2013   LABORGA STAPHYLOCOCCUS AUREUS 07/27/2013     Lab Results  Component Value Date   ALBUMIN 2.7 (L) 07/28/2013   ALBUMIN 3.0 (L) 07/27/2013   ALBUMIN 3.7 07/21/2013    No results found for: MG No results found for: VD25OH  No results found for:  PREALBUMIN CBC EXTENDED Latest Ref Rng & Units 08/03/2013 08/03/2013 08/02/2013  WBC 4.0 - 10.5 K/uL 8.8 - 11.2(H)  RBC 4.22 - 5.81 MIL/uL 2.72(L) 2.72(L) 2.64(L)  HGB 13.0 - 17.0 g/dL 8.1(L) - 7.9(L)  HCT 39 - 52 % 23.5(L) - 22.7(L)  PLT 150 - 400 K/uL 237 - 264  NEUTROABS 1.7 - 7.7 K/uL - - -  LYMPHSABS 0.7 - 4.0 K/uL - - -     There is no height or weight on file to calculate BMI.  Orders:  No orders of the defined types were placed in this encounter.  No orders of the defined types were placed in this encounter.    Procedures: No procedures performed  Clinical Data: No additional findings.  ROS:  All other systems negative, except as noted in the HPI. Review of Systems  Objective: Vital Signs: There were no vitals taken for this visit.  Specialty Comments:  No specialty comments available.  PMFS History: Patient Active Problem List   Diagnosis Date Noted  . Non-pressure chronic ulcer of left calf, limited to breakdown of skin (HCC) 09/29/2016  . Acquired absence of left leg below knee (HCC) 09/29/2016  . Diabetes (HCC) 07/27/2013  . Sepsis with cellulitis 07/27/2013  . CAD (coronary artery disease) 07/27/2013  . Hypertension 07/27/2013  . S/P BKA, Left 07/27/2013  .  BKA stump complication (HCC) 07/21/2013  . Dehiscence of closure of skin 07/14/2013   Past Medical History:  Diagnosis Date  . Complication of anesthesia    spinal and  epidural "cant take"  allergic, reports having knot and was told not to use again  . Coronary artery disease   . Diabetes mellitus without complication (HCC)   . History of blood transfusion   . Hyperlipemia   . Hypertension   . Neuropathy     History reviewed. No pertinent family history.  Past Surgical History:  Procedure Laterality Date  . AMPUTATION Left 2014   toe, foot,   . AMPUTATION Left 07/21/2013   Procedure: AMPUTATION BELOW KNEE;  Surgeon: Nadara Mustard, MD;  Location: [facility] OR;  Service: Orthopedics;  Laterality:  Left;  Revision Left Below Knee Amputation  . ANGIOPLASTY  1994  . APPENDECTOMY  1984  . BELOW KNEE LEG AMPUTATION  04/20/13  . CORONARY ANGIOPLASTY WITH STENT PLACEMENT    . STUMP REVISION Left 07/31/2013   Procedure: REVISION LEFT BELOW KNEE AMPUTATION;  Surgeon: Nadara Mustard, MD;  Location: [facility] OR;  Service: Orthopedics;  Laterality: Left;  . WISDOM TOOTH EXTRACTION     Social History   Occupational History  . Not on file  Tobacco Use  . Smoking status: Former Games developer  . Smokeless tobacco: Never Used  . Tobacco comment: QUIT SMOKING IN THE 70'S     Substance and Sexual Activity  . Alcohol use: No  . Drug use: No  . Sexual activity: Not on file    Comment: smoked approx 1 year around age 74

## 2020-10-11 ENCOUNTER — Telehealth: Payer: Medicare HMO | Admitting: Orthopedic Surgery

## 2020-10-11 NOTE — Telephone Encounter (Signed)
Faxed order to [facility] but asked if the pt needs follow up in the office first for insurance purposes. Will hold this message and callpt back pending advisement

## 2020-10-11 NOTE — Telephone Encounter (Signed)
Can you please call pt and make an appt some time next week? I confirmed with [facility] that the pt needs an appt within the past 6 months for insurance to honor the rx for a new prosthetic.

## 2020-10-11 NOTE — Telephone Encounter (Signed)
Pt called stating his leg keeps falling off; he states he's already fallen twice. Pt states [facility] will need a rx to be written in order for the pt to get his leg fixed. Pt would like a CB as soon as we have faxed this over, he also that we do it as quickly as possible so he doesn't end up falling down the stairs in his home.    [phone]

## 2020-10-21 ENCOUNTER — Ambulatory Visit: Payer: Medicare HMO | Admitting: Physician Assistant

## 2020-10-21 ENCOUNTER — Other Ambulatory Visit: Payer: Self-pay

## 2020-10-21 ENCOUNTER — Encounter: Payer: Self-pay | Admitting: Orthopedic Surgery

## 2020-10-21 DIAGNOSIS — Z89512 Acquired absence of left leg below knee: Secondary | ICD-10-CM

## 2020-10-21 NOTE — Progress Notes (Signed)
Office Visit Note   Patient: Ryan Owens           Date of Birth: [birth year]           MRN: [id number] Visit Date: 10/21/2020              Requested by: No referring provider defined for this encounter. PCP: Patient, No Pcp Per (Inactive)  Chief Complaint  Patient presents with   Left Leg - Follow-up    07/2013 bka       HPI: Patient is status post below-knee amputation.  He is requesting new supplies today.  He also has a small what he calls hotspot where with the heavier sock it is rubbing against his socket.  He has had histories of falling because of the liner being worn down Patient is an existing left transtibial  amputee.  Patient's current comorbidities are not expected to impact the ability to function with the prescribed prosthesis. Patient verbally communicates a strong desire to use a prosthesis. Patient currently requires mobility aids to ambulate without a prosthesis.  Expects not to use mobility aids with a new prosthesis.  Patient is a K3 level ambulator that spends a lot of time walking around on uneven terrain over obstacles, up and down stairs, and ambulates with a variable cadence.   Assessment & Plan: Visit Diagnoses: No diagnosis found.  Plan: Prescription was provided for  Follow-Up Instructions: No follow-ups on file.     Patient is alert, oriented, no adenopathy, well-dressed, normal affect, normal respiratory effort. Amputation stump no erythema no swelling no cellulitis.  Well-healed incision.  He has a small area of contact pressure on the lateral side but there is no skin breakdown or cellulitis  Imaging: No results found. No images are attached to the encounter.  Labs: Lab Results  Component Value Date   HGBA1C 6.8 (H) 07/22/2013   REPTSTATUS 07/29/2013 FINAL 07/27/2013   GRAMSTAIN  07/27/2013    RARE WBC PRESENT, PREDOMINANTLY PMN NO SQUAMOUS EPITHELIAL CELLS SEEN RARE GRAM POSITIVE COCCI IN PAIRS Performed at [facility]   CULT NO GROWTH Performed at [facility] 07/27/2013   LABORGA STAPHYLOCOCCUS AUREUS 07/27/2013     Lab Results  Component Value Date   ALBUMIN 2.7 (L) 07/28/2013   ALBUMIN 3.0 (L) 07/27/2013   ALBUMIN 3.7 07/21/2013    No results found for: MG No results found for: VD25OH  No results found for: PREALBUMIN CBC EXTENDED Latest Ref Rng & Units 08/03/2013 08/03/2013 08/02/2013  WBC 4.0 - 10.5 K/uL 8.8 - 11.2(H)  RBC 4.22 - 5.81 MIL/uL 2.72(L) 2.72(L) 2.64(L)  HGB 13.0 - 17.0 g/dL 8.1(L) - 7.9(L)  HCT 39.0 - 52.0 % 23.5(L) - 22.7(L)  PLT 150 - 400 K/uL 237 - 264  NEUTROABS 1.7 - 7.7 K/uL - - -  LYMPHSABS 0.7 - 4.0 K/uL - - -     There is no height or weight on file to calculate BMI.  Orders:  No orders of the defined types were placed in this encounter.  No orders of the defined types were placed in this encounter.    Procedures: No procedures performed  Clinical Data: No additional findings.  ROS:  All other systems negative, except as noted in the HPI. Review of Systems  Objective: Vital Signs: There were no vitals taken for this visit.  Specialty Comments:  No specialty comments available.  PMFS History: Patient Active Problem List   Diagnosis Date Noted   Non-pressure  chronic ulcer of left calf, limited to breakdown of skin (HCC) 09/29/2016   Acquired absence of left leg below knee (HCC) 09/29/2016   Diabetes (HCC) 07/27/2013   Sepsis with cellulitis 07/27/2013   CAD (coronary artery disease) 07/27/2013   Hypertension 07/27/2013   S/P BKA, Left 07/27/2013   BKA stump complication (HCC) 07/21/2013   Dehiscence of closure of skin 07/14/2013   Past Medical History:  Diagnosis Date   Complication of anesthesia    spinal and  epidural "cant take"  allergic, reports having knot and was told not to use again   Coronary artery disease    Diabetes mellitus without complication (HCC)    History of blood transfusion    Hyperlipemia     Hypertension    Neuropathy     No family history on file.  Past Surgical History:  Procedure Laterality Date   AMPUTATION Left 2014   toe, foot,    AMPUTATION Left 07/21/2013   Procedure: AMPUTATION BELOW KNEE;  Surgeon: Nadara Mustard, MD;  Location: [facility] OR;  Service: Orthopedics;  Laterality: Left;  Revision Left Below Knee Amputation   ANGIOPLASTY  1994   APPENDECTOMY  1984   BELOW KNEE LEG AMPUTATION  04/20/13   CORONARY ANGIOPLASTY WITH STENT PLACEMENT     STUMP REVISION Left 07/31/2013   Procedure: REVISION LEFT BELOW KNEE AMPUTATION;  Surgeon: Nadara Mustard, MD;  Location: [facility] OR;  Service: Orthopedics;  Laterality: Left;   WISDOM TOOTH EXTRACTION     Social History   Occupational History   Not on file  Tobacco Use   Smoking status: Former    Pack years: 0.00   Smokeless tobacco: Never   Tobacco comments:    QUIT SMOKING IN THE 70'S     Substance and Sexual Activity   Alcohol use: No   Drug use: No   Sexual activity: Not on file    Comment: smoked approx 1 year around age 76

## 2021-03-24 ENCOUNTER — Telehealth: Payer: Self-pay | Admitting: Orthopedic Surgery

## 2021-03-24 NOTE — Telephone Encounter (Signed)
This pt should come in for an appt. Can you call and make an appt with Denny Peon tomorrow?

## 2021-03-24 NOTE — Telephone Encounter (Signed)
Pt called stating he has a newly formed wound on the outside of his L leg crease where his amputation is. Pt states he will send pics. Via mychart so he can be advised what he should do; he states he will also ask [facility].   [phone]

## 2021-03-25 ENCOUNTER — Encounter: Payer: Medicare HMO | Admitting: Family

## 2021-03-25 NOTE — Telephone Encounter (Signed)
Pt called back stating he will keep his appt on 04/01/21.

## 2021-03-25 NOTE — Telephone Encounter (Signed)
LVM #2 asked pt to CB and left my name.

## 2021-04-01 ENCOUNTER — Encounter: Payer: Medicare HMO | Admitting: Orthopedic Surgery

## 2021-04-01 ENCOUNTER — Telehealth: Payer: Self-pay

## 2021-04-01 NOTE — Telephone Encounter (Signed)
Pt called into the office wanting [name] to refer him to an orthopedic surgeon in [place].  Pt stated he cant keep driving to [place].  He stated he is currently at [facility]

## 2021-04-02 NOTE — Telephone Encounter (Signed)
Can you please call and advise that we do not know any of the ortho offices in [place] could look on line at reviews and see what would be convenient for him

## 2021-04-02 NOTE — Telephone Encounter (Signed)
Pt informed
